# Patient Record
Sex: Male | Born: 1960
Health system: Southern US, Community
[De-identification: ages and names within clinical notes are randomized; demographics above are authoritative.]

## PROBLEM LIST (undated history)

## (undated) DIAGNOSIS — R202 Paresthesia of skin: Secondary | ICD-10-CM

## (undated) DIAGNOSIS — I10 Essential (primary) hypertension: Secondary | ICD-10-CM

## (undated) DIAGNOSIS — M109 Gout, unspecified: Secondary | ICD-10-CM

## (undated) DIAGNOSIS — M719 Bursopathy, unspecified: Secondary | ICD-10-CM

## (undated) HISTORY — DX: Essential (primary) hypertension: I10

## (undated) HISTORY — DX: Bursopathy, unspecified: M71.9

## (undated) HISTORY — DX: Gout, unspecified: M10.9

## (undated) HISTORY — DX: Paresthesia of skin: R20.2

## (undated) HISTORY — PX: TONSILLECTOMY: SUR1361

---

## 2008-02-16 ENCOUNTER — Ambulatory Visit (HOSPITAL_COMMUNITY): Admission: RE | Admit: 2008-02-16 | Discharge: 2008-02-16 | Payer: Self-pay | Admitting: Family Medicine

## 2012-01-18 ENCOUNTER — Encounter: Payer: Self-pay | Admitting: Internal Medicine

## 2012-02-21 ENCOUNTER — Encounter: Payer: Self-pay | Admitting: Internal Medicine

## 2012-02-21 ENCOUNTER — Ambulatory Visit (AMBULATORY_SURGERY_CENTER): Payer: 59 | Admitting: *Deleted

## 2012-02-21 VITALS — Ht 70.5 in | Wt 220.6 lb

## 2012-02-21 DIAGNOSIS — Z1211 Encounter for screening for malignant neoplasm of colon: Secondary | ICD-10-CM

## 2012-02-21 MED ORDER — PEG-KCL-NACL-NASULF-NA ASC-C 100 G PO SOLR: ORAL | Status: DC

## 2012-02-21 NOTE — Progress Notes (Signed)
No allergies to eggs or soy products 

## 2012-03-02 ENCOUNTER — Encounter: Payer: Self-pay | Admitting: Internal Medicine

## 2012-03-03 ENCOUNTER — Encounter: Payer: Self-pay | Admitting: Internal Medicine

## 2012-03-03 ENCOUNTER — Ambulatory Visit (AMBULATORY_SURGERY_CENTER): Payer: 59 | Admitting: Internal Medicine

## 2012-03-03 VITALS — BP 130/84 | HR 53 | Temp 96.9°F | Resp 15 | Ht 70.5 in | Wt 220.0 lb

## 2012-03-03 DIAGNOSIS — Z1211 Encounter for screening for malignant neoplasm of colon: Secondary | ICD-10-CM

## 2012-03-03 MED ORDER — SODIUM CHLORIDE 0.9 % IV SOLN: 500.0000 mL | INTRAVENOUS | Status: DC

## 2012-03-03 NOTE — Patient Instructions (Addendum)
YOU HAD AN ENDOSCOPIC PROCEDURE TODAY AT THE [facility]: Refer to the procedure report that was given to you for any specific questions about what was found during the examination.  If the procedure report does not answer your questions, please call your gastroenterologist to clarify.  If you requested that your care partner not be given the details of your procedure findings, then the procedure report has been included in a sealed envelope for you to review at your convenience later.  YOU SHOULD EXPECT: Some feelings of bloating in the abdomen. Passage of more gas than usual.  Walking can help get rid of the air that was put into your GI tract during the procedure and reduce the bloating. If you had a lower endoscopy (such as a colonoscopy or flexible sigmoidoscopy) you may notice spotting of blood in your stool or on the toilet paper. If you underwent a bowel prep for your procedure, then you may not have a normal bowel movement for a few days.  DIET: Your first meal following the procedure should be a light meal and then it is ok to progress to your normal diet.  A half-sandwich or bowl of soup is an example of a good first meal.  Heavy or fried foods are harder to digest and may make you feel nauseous or bloated.  Likewise meals heavy in dairy and vegetables can cause extra gas to form and this can also increase the bloating.  Drink plenty of fluids but you should avoid alcoholic beverages for 24 hours.  ACTIVITY: Your care partner should take you home directly after the procedure.  You should plan to take it easy, moving slowly for the rest of the day.  You can resume normal activity the day after the procedure however you should NOT DRIVE or use heavy machinery for 24 hours (because of the sedation medicines used during the test).    SYMPTOMS TO REPORT IMMEDIATELY: A gastroenterologist can be reached at any hour.  During normal business hours, 8:30 AM to 5:00 PM Monday through Friday,  call [phone].  After hours and on weekends, please call the GI answering service at [phone] who will take a message and have the physician on call contact you.   Following lower endoscopy (colonoscopy or flexible sigmoidoscopy):  Excessive amounts of blood in the stool  Significant tenderness or worsening of abdominal pains  Swelling of the abdomen that is new, acute  Fever of 100F or higher  FOLLOW UP: If any biopsies were taken you will be contacted by phone or by letter within the next 1-3 weeks.  Call your gastroenterologist if you have not heard about the biopsies in 3 weeks.  Our staff will call the home number listed on your records the next business day following your procedure to check on you and address any questions or concerns that you may have at that time regarding the information given to you following your procedure. This is a courtesy call and so if there is no answer at the home number and we have not heard from you through the emergency physician on call, we will assume that you have returned to your regular daily activities without incident.  SIGNATURES/CONFIDENTIALITY: You and/or your care partner have signed paperwork which will be entered into your electronic medical record.  These signatures attest to the fact that that the information above on your After Visit Summary has been reviewed and is understood.  Full responsibility of the confidentiality of this   discharge information lies with you and/or your care-partner.  Repeat colonoscopy in 10 years.  

## 2012-03-03 NOTE — Op Note (Signed)
[facility] [address] [place], [address]   COLONOSCOPY PROCEDURE REPORT  PATIENT: Robert Brooks, Robert Brooks  MR#: [id number] BIRTHDATE: [birth year] , 51  yrs. old GENDER: Male ENDOSCOPIST: Beverley Fiedler, MD REFERRED AO:[id number], Scott PROCEDURE DATE:  03/03/2012 PROCEDURE:   Colonoscopy, screening ASA CLASS:   Class I INDICATIONS:average risk screening and first colonoscopy. MEDICATIONS: MAC sedation, administered by CRNA and propofol (Diprivan) 400mg  IV  DESCRIPTION OF PROCEDURE:   After the risks benefits and alternatives of the procedure were thoroughly explained, informed consent was obtained.  A digital rectal exam revealed no rectal mass.   The LB CF-[id number]  endoscope was introduced through the anus and advanced to the terminal ileum which was intubated for a short distance. No adverse events experienced.   The quality of the prep was Moviprep fair  The instrument was then slowly withdrawn as the colon was fully examined.      COLON FINDINGS: The mucosa appeared normal in the terminal ileum. A normal appearing cecum, ileocecal valve, and appendiceal orifice were identified.  The ascending, hepatic flexure, transverse, splenic flexure, descending, sigmoid colon and rectum appeared unremarkable.  No polyps or cancers were seen.  Retroflexed views revealed no abnormalities. The time to cecum=3 minutes 51 seconds. Withdrawal time=11 minutes 20 seconds.  The scope was withdrawn and the procedure completed.  COMPLICATIONS: There were no complications.  ENDOSCOPIC IMPRESSION: 1.   Normal mucosa in the terminal ileum 2.   Normal colon  RECOMMENDATIONS: You should continue to follow colorectal cancer screening guidelines for "routine risk" patients with a repeat colonoscopy in 10 years. There is no need for FOBT (stool) testing for at least 5 years.   eSigned:  Beverley Fiedler, MD 03/03/2012 11:04 AM   cc: The Patient and Lilyan Punt, MD

## 2012-03-06 ENCOUNTER — Telehealth: Payer: Self-pay | Admitting: *Deleted

## 2012-03-06 NOTE — Telephone Encounter (Signed)
  Follow up Call-  Call back number 03/03/2012  Post procedure Call Back phone  # [phone]  Permission to leave phone message Yes     Patient questions:  Do you have a fever, pain , or abdominal swelling? no Pain Score  0 *  Have you tolerated food without any problems? yes  Have you been able to return to your normal activities? yes  Do you have any questions about your discharge instructions: Diet   no Medications  no Follow up visit  no  Do you have questions or concerns about your Care? no  Actions: * If pain score is 4 or above: No action needed, pain <4.

## 2012-09-19 ENCOUNTER — Ambulatory Visit (INDEPENDENT_AMBULATORY_CARE_PROVIDER_SITE_OTHER): Payer: BC Managed Care – PPO | Admitting: Family Medicine

## 2012-09-19 ENCOUNTER — Encounter: Payer: Self-pay | Admitting: Family Medicine

## 2012-09-19 VITALS — BP 150/100 | HR 80 | Wt 216.0 lb

## 2012-09-19 DIAGNOSIS — R0600 Dyspnea, unspecified: Secondary | ICD-10-CM

## 2012-09-19 DIAGNOSIS — R030 Elevated blood-pressure reading, without diagnosis of hypertension: Secondary | ICD-10-CM

## 2012-09-19 DIAGNOSIS — R0989 Other specified symptoms and signs involving the circulatory and respiratory systems: Secondary | ICD-10-CM

## 2012-09-19 DIAGNOSIS — R9431 Abnormal electrocardiogram [ECG] [EKG]: Secondary | ICD-10-CM | POA: Insufficient documentation

## 2012-09-19 NOTE — Patient Instructions (Signed)
DASH Diet  The DASH diet stands for "Dietary Approaches to Stop Hypertension." It is a healthy eating plan that has been shown to reduce high blood pressure (hypertension) in as little as 14 days, while also possibly providing other significant health benefits. These other health benefits include reducing the risk of breast cancer after menopause and reducing the risk of type 2 diabetes, heart disease, colon cancer, and stroke. Health benefits also include weight loss and slowing kidney failure in patients with chronic kidney disease.   DIET GUIDELINES  · Limit salt (sodium). Your diet should contain less than 1500 mg of sodium daily.  · Limit refined or processed carbohydrates. Your diet should include mostly whole grains. Desserts and added sugars should be used sparingly.  · Include small amounts of heart-healthy fats. These types of fats include nuts, oils, and tub margarine. Limit saturated and trans fats. These fats have been shown to be harmful in the body.  CHOOSING FOODS   The following food groups are based on a 2000 calorie diet. See your Registered Dietitian for individual calorie needs.  Grains and Grain Products (6 to 8 servings daily)  · Eat More Often: Whole-wheat bread, brown rice, whole-grain or wheat pasta, quinoa, popcorn without added fat or salt (air popped).  · Eat Less Often: White bread, white pasta, white rice, cornbread.  Vegetables (4 to 5 servings daily)  · Eat More Often: Fresh, frozen, and canned vegetables. Vegetables may be raw, steamed, roasted, or grilled with a minimal amount of fat.  · Eat Less Often/Avoid: Creamed or fried vegetables. Vegetables in a cheese sauce.  Fruit (4 to 5 servings daily)  · Eat More Often: All fresh, canned (in natural juice), or frozen fruits. Dried fruits without added sugar. One hundred percent fruit juice (½ cup [237 mL] daily).  · Eat Less Often: Dried fruits with added sugar. Canned fruit in light or heavy syrup.  Lean Meats, Fish, and Poultry (2  servings or less daily. One serving is 3 to 4 oz [85-114 g]).  · Eat More Often: Ninety percent or leaner ground beef, tenderloin, sirloin. Round cuts of beef, chicken breast, turkey breast. All fish. Grill, bake, or broil your meat. Nothing should be fried.  · Eat Less Often/Avoid: Fatty cuts of meat, turkey, or chicken leg, thigh, or wing. Fried cuts of meat or fish.  Dairy (2 to 3 servings)  · Eat More Often: Low-fat or fat-free milk, low-fat plain or light yogurt, reduced-fat or part-skim cheese.  · Eat Less Often/Avoid: Milk (whole, 2%). Whole milk yogurt. Full-fat cheeses.  Nuts, Seeds, and Legumes (4 to 5 servings per week)  · Eat More Often: All without added salt.  · Eat Less Often/Avoid: Salted nuts and seeds, canned beans with added salt.  Fats and Sweets (limited)  · Eat More Often: Vegetable oils, tub margarines without trans fats, sugar-free gelatin. Mayonnaise and salad dressings.  · Eat Less Often/Avoid: Coconut oils, palm oils, butter, stick margarine, cream, half and half, cookies, candy, pie.  FOR MORE INFORMATION  The Dash Diet Eating Plan: www.dashdiet.org  Document Released: 02/18/2011 Document Revised: 05/24/2011 Document Reviewed: 02/18/2011  ExitCare® Patient Information ©2014 [employer].

## 2012-09-19 NOTE — Progress Notes (Signed)
  Subjective:    Patient ID: Robert Brooks, male    DOB: January 09, 1961, 52 y.o.   MRN: [id number]  HPI Here to get referral to cardiology. 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 this patient recently had evaluation for his workplace was evaluated and was told he had abnormal EKG comes here for evaluation he denies chest tightness pressure pain shortness of breath. He cannot run because of his legs give him significant pain with a lot of running especially his shins. He denies any injury. Denies any history of heart disease hypertension. Does not smoke no family history of premature heart disease. His workplace were is requiring him to get checked out before they will allow him to be hired.    Review of Systems negative as per above no PND no orthopnea no swelling in the legs no fevers or cough no chest tightness pressure pain or shortness of breath     Objective:   Physical Exam  Blood pressure is mildly elevated on recheck it was 148/96 he states in the past has been normal and has been normal here at the office several times it is possible he is stressed out because of the recent problems with his EKG  Lungs are clear no crackles heart is regular no murmurs pulses are normal extremities no edema abdomen is soft EKG does have some T-wave inversions on the lateral leads.      Assessment & Plan:  #1 elevated blood pressure-he is encouraged to do walking for exercise watch his diet try to reduce his weight a few pounds. In addition to this to do the DASH diet. It is possible he may have to be on medication in the future #2 abnormal EKG-even though I think his risk of heart disease is low in his recent cholesterol numbers look fairly good I would highly recommend cardiology evaluation along with a stress test and possibly a echo to help delineate what is going on. This will also help him with his future employment.

## 2012-09-20 NOTE — Addendum Note (Signed)
Addended by: Lilyan Punt A on: 09/20/2012 05:30 PM   Modules accepted: Orders

## 2012-09-22 ENCOUNTER — Encounter: Payer: Self-pay | Admitting: *Deleted

## 2012-09-22 ENCOUNTER — Encounter: Payer: Self-pay | Admitting: Cardiovascular Disease

## 2012-09-22 ENCOUNTER — Ambulatory Visit (INDEPENDENT_AMBULATORY_CARE_PROVIDER_SITE_OTHER): Payer: BC Managed Care – PPO | Admitting: Cardiovascular Disease

## 2012-09-22 ENCOUNTER — Ambulatory Visit: Payer: 59 | Admitting: Cardiovascular Disease

## 2012-09-22 VITALS — BP 124/80 | HR 80 | Ht 70.5 in | Wt 211.0 lb

## 2012-09-22 DIAGNOSIS — R9431 Abnormal electrocardiogram [ECG] [EKG]: Secondary | ICD-10-CM

## 2012-09-22 NOTE — Patient Instructions (Addendum)
Your physician recommends that you schedule a follow-up appointment in: TO BE DETERMINED  Your physician has requested that you have an echocardiogram. Echocardiography is a painless test that uses sound waves to create images of your heart. It provides your doctor with information about the size and shape of your heart and how well your heart's chambers and valves are working. This procedure takes approximately one hour. There are no restrictions for this procedure.  Your physician has requested that you have en exercise stress myoview. For further information please visit [email or web address] Please follow instruction sheet, as given.

## 2012-09-22 NOTE — Progress Notes (Signed)
Patient ID: Robert Brooks, male   DOB: 14-Jan-1961, 52 y.o.   MRN: [id number]    CARDIOLOGY CONSULT NOTE  Patient ID: Robert Brooks MRN: [id number] DOB/AGE: January 09, 1961 52 y.o.  Admit date: (Not on file) Primary Physician: Robert Punt, MD Reason for Consultation: abnormal ECG  HPI:  Robert Brooks is a 52 y.o. Male who presents for evaluation for an abnormal ECG.  A lipid panel on 07/23/2012 showed an LDL of 133, HDL of 50, TG 99, with a total cholesterol of 203 mg/dL.  He recently retired from the [employer] and is planning on working for the [employer].   He had a screening physical and ECG, and due to an abnormal ECG, he was referred today.  He walks 4 miles daily 5 days a week. He feels well and is without chest pain or shortness of breath.  His father died at 45 of CHF, but didn't exercise and also smoked.  He's put on 6 lbs since retirement.  Review of systems complete and found to be negative unless listed above in HPI  Past Medical History: As per HPI, none to speak of.  Soc Hx: retired June 13, 2012 from the [employer] He's worked for the [employer] for the past 7 years as well.  Family History  Problem Relation Age of Onset  . Colon cancer Neg Hx   . Esophageal cancer Neg Hx   . Rectal cancer Neg Hx   . Stomach cancer Neg Hx     History   Social History  . Marital Status: Married    Spouse Name: N/A    Number of Children: N/A  . Years of Education: N/A   Occupational History  . Not on file.   Social History Main Topics  . Smoking status: Never Smoker   . Smokeless tobacco: Never Used  . Alcohol Use: Yes     Comment: socially  . Drug Use: No  . Sexually Active:    Other Topics Concern  . Not on file   Social History Narrative  . No narrative on file      (Not in a hospital admission)  Physical exam BP: 124/80 mmHg HR: 80 bpm  General: NAD Neck: No JVD, no thyromegaly or thyroid nodule.  Lungs: Clear to  auscultation bilaterally with normal respiratory effort. CV: Nondisplaced PMI.  Heart regular S1/S2, no S3/S4, no murmur.  No peripheral edema.  No carotid bruit.  Normal pedal pulses.  Abdomen: Soft, nontender, no hepatosplenomegaly, no distention.  Skin: Intact without lesions or rashes.  Neurologic: Alert and oriented x 3.  Psych: Normal affect. Extremities: No clubbing or cyanosis.  HEENT: Normal.   Labs:   No results found for this basename: WBC, HGB, HCT, MCV, PLT   No results found for this basename: NA, K, CL, CO2, BUN, CREATININE, CALCIUM, LABALBU, PROT, BILITOT, ALKPHOS, ALT, AST, GLUCOSE,  in the last 168 hours No results found for this basename: CKTOTAL, CKMB, CKMBINDEX, TROPONINI    No results found for this basename: CHOL   No results found for this basename: HDL   No results found for this basename: LDLCALC   No results found for this basename: TRIG   No results found for this basename: CHOLHDL   No results found for this basename: LDLDIRECT       EKG: Sinus rhythm, rate 63 bpm, axis within normal limits, intervals within normal limits, ST-T abnormality in lateral leads, consider ischemia. T wave flattening in  inferior leads.    ASSESSMENT AND PLAN:  1. Abnormal ECG: he is asymptomatic and without a PMH to speak of. Given the [employer] requirements, I will proceed with an echocardiogram and treadmill Myoview stress test.  Signed: Prentice Brooks, M.D., F.A.C.C. 09/22/2012, 1:00 PM

## 2012-09-27 ENCOUNTER — Ambulatory Visit (HOSPITAL_COMMUNITY)
Admission: RE | Admit: 2012-09-27 | Discharge: 2012-09-27 | Disposition: A | Discharge status: Home / Self Care | Payer: BC Managed Care – PPO | Source: Ambulatory Visit | Attending: Cardiovascular Disease | Admitting: Cardiovascular Disease

## 2012-09-27 ENCOUNTER — Encounter (HOSPITAL_COMMUNITY)
Admission: RE | Admit: 2012-09-27 | Discharge: 2012-09-27 | Disposition: A | Discharge status: Home / Self Care | Payer: BC Managed Care – PPO | Source: Ambulatory Visit | Attending: Cardiovascular Disease | Admitting: Cardiovascular Disease

## 2012-09-27 ENCOUNTER — Encounter (HOSPITAL_COMMUNITY): Payer: Self-pay

## 2012-09-27 DIAGNOSIS — R9431 Abnormal electrocardiogram [ECG] [EKG]: Secondary | ICD-10-CM

## 2012-09-27 DIAGNOSIS — I517 Cardiomegaly: Secondary | ICD-10-CM

## 2012-09-27 MED ORDER — TECHNETIUM TC 99M SESTAMIBI - CARDIOLITE
30.0000 | Freq: Once | INTRAVENOUS | Status: AC | PRN
  Administered 2012-09-27: 12:00:00 31 via INTRAVENOUS

## 2012-09-27 MED ORDER — SODIUM CHLORIDE 0.9 % IJ SOLN
INTRAMUSCULAR | Status: AC
  Administered 2012-09-27: 10 mL via INTRAVENOUS
  Filled 2012-09-27: qty 10

## 2012-09-27 MED ORDER — TECHNETIUM TC 99M SESTAMIBI - CARDIOLITE
10.0000 | Freq: Once | INTRAVENOUS | Status: AC | PRN
  Administered 2012-09-27: 10:00:00 10 via INTRAVENOUS

## 2012-09-27 NOTE — Progress Notes (Signed)
Stress Lab Nurses Notes - [facility]  CORNELIOUS BARTOLUCCI 09/27/2012 Reason for doing test: Abnormal ECG Type of test: Stress Cardiolite Nurse performing test: Parke Poisson, RN Nuclear Medicine Tech: Lou Cal Echo Tech: Not Applicable MD performing test: R. Rothbart & Jacolyn Reedy PA Family MD: Lilyan Punt Test explained and consent signed: yes IV started: 22g jelco, Saline lock flushed, No redness or edema and Saline lock started in radiology Symptoms: Fatigue in legs Treatment/Intervention: None Reason test stopped: fatigue After recovery IV was: Discontinued via X-ray tech and No redness or edema Patient to return to Nuc. Med at : 12:45 Patient discharged: Home Patient's Condition upon discharge was: stable Comments: During test peak BP 138/82 & HR 166.  Recovery BP 113/67 & HR 96.  Symptoms resolved in recovery. Erskine Speed T

## 2012-09-27 NOTE — Progress Notes (Signed)
*  PRELIMINARY RESULTS* Echocardiogram 2D Echocardiogram has been performed.  Conrad Wagener 09/27/2012, 8:49 AM

## 2012-09-28 ENCOUNTER — Telehealth: Payer: Self-pay | Admitting: Cardiology

## 2012-09-28 NOTE — Telephone Encounter (Signed)
Results of tests that were done yesterday (09/27/12)/tgs

## 2012-09-29 NOTE — Telephone Encounter (Signed)
Spoke to pt concerning test results and forms for his employment, Dr. Purvis Sheffield completed forms,printed pt office notes/tests/EKGs Pt came to office and picked up all paperwork, will contact office with any further concerns if needed

## 2013-03-13 ENCOUNTER — Telehealth: Payer: Self-pay | Admitting: Family Medicine

## 2013-03-13 NOTE — Telephone Encounter (Signed)
No. Can discuss further with dr Lorin Picket at [facility] for yearly p e in several months

## 2013-03-13 NOTE — Telephone Encounter (Signed)
Patient had his physical and colonoscopy last year. He did not have any family history of having any colon cancer until about 9 months ago. He wants to know if since he now has family history of this should he start being checked more often than 10 years?

## 2013-03-13 NOTE — Telephone Encounter (Signed)
Notified patient no. Can discuss further with dr scott at [facility] for yearly p e in several months. Patient verbalized understanding.

## 2013-12-18 IMAGING — NM NM MYOCAR SINGLE W/SPECT W/WALL MOTION & EF
1 series · 6 of 6 positions shown · non-contrast
Comparison: none

Ordering Physician: LUSINE JIM

[place]olan Physician: [REDACTED]al Data: 51-year-old law enforcement officer with EKG
abnormalities.
NUCLEAR MEDICINE STRESS MYOVIEW STUDY WITH SPECT AND LEFT
VENTRICULAR EJECTION FRACTION
Radionuclide Data: One-day rest/stress protocol performed with
[DATE] mCi of 7c-EEm Myoview.
Stress Data: Treadmill exercise performed to a workload of
mets and a heart rate of 169, 99% of age predicted maximum.
Exercise discontinued due to mild leg discomfort; no chest
discomfort reported.  No arrhythmias noted.  Blood pressure
increased from a resting value of 120/80 to 140/80 at peak
exercise, a fairly flat response.
EKG: Sinus bradycardia at a rate of 51 bpm; inferolateral/apical
distribution of T-wave inversion.
Stress EKG:  T-wave abnormalities normalized; no significant ST-
segment changes.  No arrhythmias.
Scintigraphic Data: Acquisition notable for mild diaphragmatic
attenuation.  Left ventricular size was normal.  On tomographic
images reconstructed in standard planes, there was uniform and
normal uptake of tracer in all myocardial segments.  The rest
images were unchanged.  The gated reconstruction demonstrated
normal regional and global LV systolic function as well as normal
systolic accentuation of activity throughout.  Estimated ejection
fraction was 54%.

[cr cardiac tc low dose · 6.41mm/px · 6 of 64 frames shown]
[frame 6/64]
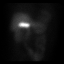
[frame 16/64]
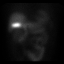
[frame 27/64]
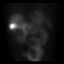
[frame 38/64]
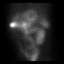
[frame 48/64]
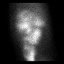
[frame 59/64]
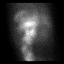

[6 of 6 positions shown; findings below may reference images not displayed]

IMPRESSION: Negative stress nuclear myocardial study with excellent exercise
tolerance.

## 2015-05-01 ENCOUNTER — Telehealth: Payer: Self-pay | Admitting: Family Medicine

## 2015-05-01 MED ORDER — OSELTAMIVIR PHOSPHATE 75 MG PO CAPS: 75.0000 mg | ORAL_CAPSULE | Freq: Two times a day (BID) | ORAL | Status: DC

## 2015-05-01 NOTE — Telephone Encounter (Signed)
Patient reports fever, cough, headache and body aches-started last night

## 2015-05-01 NOTE — Telephone Encounter (Signed)
Rx sent electronically to pharmacy. Patient notified and advised to follow up in office if ongoing problems.

## 2015-05-01 NOTE — Telephone Encounter (Signed)
Typically for this what is recommended is Tamiflu 75 mg capsule one twice a day for 5 days. May send in prescription. If ongoing troubles complications or problems I highly recommend being checked

## 2015-05-01 NOTE — Telephone Encounter (Signed)
Wife was seen on Monday  With the flu and now husband has the flu and wanting something called in [place]. [phone]

## 2015-05-01 NOTE — Addendum Note (Signed)
Addended by: Dairl Ponder on: 05/01/2015 10:33 AM   Modules accepted: Orders

## 2015-07-10 ENCOUNTER — Telehealth: Payer: Self-pay | Admitting: Family Medicine

## 2015-07-10 DIAGNOSIS — Z139 Encounter for screening, unspecified: Secondary | ICD-10-CM

## 2015-07-10 DIAGNOSIS — I10 Essential (primary) hypertension: Secondary | ICD-10-CM

## 2015-07-10 DIAGNOSIS — Z125 Encounter for screening for malignant neoplasm of prostate: Secondary | ICD-10-CM

## 2015-07-10 NOTE — Telephone Encounter (Signed)
CBC, liver, met 7, lipid, PSA

## 2015-07-10 NOTE — Telephone Encounter (Signed)
bw orders for a PE on 5/22  Last labs scanned in from his job 2014

## 2015-07-10 NOTE — Telephone Encounter (Signed)
Orders for bloodwork put in. Pt notified.  

## 2015-07-29 LAB — HEPATIC FUNCTION PANEL
ALT: 23 IU/L (ref 0–44)
AST: 22 IU/L (ref 0–40)
Albumin: 4.5 g/dL (ref 3.5–5.5)
Alkaline Phosphatase: 51 IU/L (ref 39–117)
Bilirubin Total: 0.8 mg/dL (ref 0.0–1.2)
Bilirubin, Direct: 0.21 mg/dL (ref 0.00–0.40)
Total Protein: 6.5 g/dL (ref 6.0–8.5)

## 2015-07-29 LAB — BASIC METABOLIC PANEL
BUN / CREAT RATIO: 20 (ref 9–20)
BUN: 21 mg/dL (ref 6–24)
CALCIUM: 9.4 mg/dL (ref 8.7–10.2)
CHLORIDE: 105 mmol/L (ref 96–106)
CO2: 22 mmol/L (ref 18–29)
Creatinine, Ser: 1.06 mg/dL (ref 0.76–1.27)
GFR calc Af Amer: 92 mL/min/{1.73_m2} (ref >59)
GFR calc non Af Amer: 80 mL/min/{1.73_m2} (ref >59)
GLUCOSE: 85 mg/dL (ref 65–99)
POTASSIUM: 5 mmol/L (ref 3.5–5.2)
Sodium: 143 mmol/L (ref 134–144)

## 2015-07-29 LAB — CBC WITH DIFFERENTIAL/PLATELET
BASOS ABS: 0 10*3/uL (ref 0.0–0.2)
Basos: 1 %
EOS (ABSOLUTE): 0.1 10*3/uL (ref 0.0–0.4)
Eos: 2 %
HEMOGLOBIN: 16.2 g/dL (ref 12.6–17.7)
Hematocrit: 46.5 % (ref 37.5–51.0)
Immature Grans (Abs): 0 10*3/uL (ref 0.0–0.1)
Immature Granulocytes: 0 %
LYMPHS ABS: 2 10*3/uL (ref 0.7–3.1)
LYMPHS: 37 %
MCH: 30.8 pg (ref 26.6–33.0)
MCHC: 34.8 g/dL (ref 31.5–35.7)
MCV: 88 fL (ref 79–97)
MONOCYTES: 10 %
Monocytes Absolute: 0.5 10*3/uL (ref 0.1–0.9)
NEUTROS ABS: 2.6 10*3/uL (ref 1.4–7.0)
Neutrophils: 50 %
PLATELETS: 231 10*3/uL (ref 150–379)
RBC: 5.26 x10E6/uL (ref 4.14–5.80)
RDW: 13.3 % (ref 12.3–15.4)
WBC: 5.3 10*3/uL (ref 3.4–10.8)

## 2015-07-29 LAB — LIPID PANEL
CHOL/HDL RATIO: 2.9 ratio (ref 0.0–5.0)
CHOLESTEROL TOTAL: 168 mg/dL (ref 100–199)
HDL: 58 mg/dL (ref >39)
LDL CALC: 96 mg/dL (ref 0–99)
TRIGLYCERIDES: 72 mg/dL (ref 0–149)
VLDL Cholesterol Cal: 14 mg/dL (ref 5–40)

## 2015-07-29 LAB — PSA: PROSTATE SPECIFIC AG, SERUM: 0.5 ng/mL (ref 0.0–4.0)

## 2015-08-04 ENCOUNTER — Encounter: Payer: Self-pay | Admitting: Family Medicine

## 2015-08-04 ENCOUNTER — Ambulatory Visit (INDEPENDENT_AMBULATORY_CARE_PROVIDER_SITE_OTHER): Payer: No Typology Code available for payment source | Admitting: Family Medicine

## 2015-08-04 VITALS — BP 118/80 | Ht 69.25 in | Wt 201.0 lb

## 2015-08-04 DIAGNOSIS — H9319 Tinnitus, unspecified ear: Secondary | ICD-10-CM | POA: Insufficient documentation

## 2015-08-04 DIAGNOSIS — H9313 Tinnitus, bilateral: Secondary | ICD-10-CM | POA: Diagnosis not present

## 2015-08-04 DIAGNOSIS — Z800 Family history of malignant neoplasm of digestive organs: Secondary | ICD-10-CM | POA: Diagnosis not present

## 2015-08-04 DIAGNOSIS — Z0000 Encounter for general adult medical examination without abnormal findings: Secondary | ICD-10-CM

## 2015-08-04 DIAGNOSIS — M1000 Idiopathic gout, unspecified site: Secondary | ICD-10-CM | POA: Diagnosis not present

## 2015-08-04 NOTE — Progress Notes (Addendum)
   Subjective:    Patient ID: Robert Brooks, male    DOB: 06/07/1960, 55 y.o.   MRN: [id number]  HPI The patient comes in today for a wellness visit.  Last colonoscopy 03/03/2012.   A review of their health history was completed.  A review of medications was also completed.  Any needed refills; not taking any prescriptions   Eating habits: health conscious  Falls/  MVA accidents in past few months: none  Regular exercise: walks every day 2 -3 miles.   Specialist pt sees on regular basis: none  Preventative health issues were discussed.   Additional concerns: ears are ringing. Started 7 months. Pt states he will see ENT for this issue.   Gout in both feet. Patient states he did have problems with this in the past but is gone away since she's been following a better diet he is not interested in doing anything about currently    Review of Systems  Constitutional: Negative for fever, activity change and appetite change.  HENT: Negative for congestion and rhinorrhea.   Eyes: Negative for discharge.  Respiratory: Negative for cough and wheezing.   Cardiovascular: Negative for chest pain.  Gastrointestinal: Negative for vomiting, abdominal pain and blood in stool.  Genitourinary: Negative for frequency and difficulty urinating.  Musculoskeletal: Negative for neck pain.  Skin: Negative for rash.  Allergic/Immunologic: Negative for environmental allergies and food allergies.  Neurological: Negative for weakness and headaches.  Psychiatric/Behavioral: Negative for agitation.       Objective:   Physical Exam  Constitutional: He appears well-developed and well-nourished.  HENT:  Head: Normocephalic and atraumatic.  Right Ear: External ear normal.  Left Ear: External ear normal.  Nose: Nose normal.  Mouth/Throat: Oropharynx is clear and moist.  Eyes: EOM are normal. Pupils are equal, round, and reactive to light.  Neck: Normal range of motion. Neck supple. No thyromegaly  present.  Cardiovascular: Normal rate, regular rhythm and normal heart sounds.   No murmur heard. Pulmonary/Chest: Effort normal and breath sounds normal. No respiratory distress. He has no wheezes.  Abdominal: Soft. Bowel sounds are normal. He exhibits no distension and no mass. There is no tenderness.  Genitourinary: Penis normal.  Musculoskeletal: Normal range of motion. He exhibits no edema.  Lymphadenopathy:    He has no cervical adenopathy.  Neurological: He is alert. He exhibits normal muscle tone.  Skin: Skin is warm and dry. No erythema.  Psychiatric: He has a normal mood and affect. His behavior is normal. Judgment normal.    tinnitis      Assessment & Plan:  1. Routine general medical examination at a health care facility Overall patient doing well safety dietary discussed. Recent lab work looks great. Up-to-date on colonoscopy will need one next year because of family history mom was diagnosed a few years ago  2. Idiopathic gout, unspecified chronicity, unspecified site Patient has occasional gout will follow-up low purine diet no lab work indicated if he has more trouble he will let us know  3. 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

## 2015-09-10 ENCOUNTER — Encounter: Payer: Self-pay | Admitting: Family Medicine

## 2015-09-10 ENCOUNTER — Ambulatory Visit (INDEPENDENT_AMBULATORY_CARE_PROVIDER_SITE_OTHER): Payer: No Typology Code available for payment source | Admitting: Family Medicine

## 2015-09-10 VITALS — BP 102/70 | Temp 98.1°F | Ht 69.25 in | Wt 202.4 lb

## 2015-09-10 DIAGNOSIS — M10072 Idiopathic gout, left ankle and foot: Secondary | ICD-10-CM | POA: Diagnosis not present

## 2015-09-10 MED ORDER — COLCHICINE 0.6 MG PO TABS: ORAL_TABLET | ORAL | Status: DC

## 2015-09-10 MED ORDER — NAPROXEN 500 MG PO TABS: 500.0000 mg | ORAL_TABLET | Freq: Two times a day (BID) | ORAL | Status: DC

## 2015-09-10 NOTE — Progress Notes (Signed)
   Subjective:    Patient ID: Robert Brooks, male    DOB: [birth year], 55 y.o.   MRN: [id number]  Foot Pain This is a new problem. The current episode started in the past 7 days. The problem occurs constantly. The problem has been unchanged. Nothing aggravates the symptoms. He has tried NSAIDs for the symptoms. The treatment provided mild relief.   Patient has no other concerns at this time.   Patient states he's been eating a lot more protein had a couple beers related pain and discomfort been progressive over the course of next several days Review of Systems Patient relates pain discomfort in the joint. Some redness and swelling    Objective:   Physical Exam On physical exam there is apparent gout on the left foot. At the MTP joint. Ankle calf is normal       Assessment & Plan:  Gout-Importance of low purine diet in addition to this recommend Naprosyn twice a day until relief plus also consider Colcrys Patient was also told the importance of checking blood level in May need to recheck it again in order to make sure that uric acid level is staying low preferably below 6

## 2015-09-10 NOTE — Patient Instructions (Signed)

## 2015-09-11 ENCOUNTER — Other Ambulatory Visit: Payer: Self-pay

## 2015-09-11 DIAGNOSIS — M109 Gout, unspecified: Secondary | ICD-10-CM

## 2015-09-11 LAB — BASIC METABOLIC PANEL
BUN/Creatinine Ratio: 19 (ref 9–20)
BUN: 19 mg/dL (ref 6–24)
CHLORIDE: 105 mmol/L (ref 96–106)
CO2: 26 mmol/L (ref 18–29)
CREATININE: 1 mg/dL (ref 0.76–1.27)
Calcium: 9.4 mg/dL (ref 8.7–10.2)
GFR calc Af Amer: 99 mL/min/{1.73_m2} (ref >59)
GFR calc non Af Amer: 86 mL/min/{1.73_m2} (ref >59)
GLUCOSE: 75 mg/dL (ref 65–99)
POTASSIUM: 4.8 mmol/L (ref 3.5–5.2)
SODIUM: 148 mmol/L — AB (ref 134–144)

## 2015-09-11 LAB — URIC ACID: URIC ACID: 7.7 mg/dL (ref 3.7–8.6)

## 2015-11-22 ENCOUNTER — Other Ambulatory Visit: Payer: Self-pay | Admitting: Family Medicine

## 2015-11-22 DIAGNOSIS — M10072 Idiopathic gout, left ankle and foot: Secondary | ICD-10-CM

## 2017-03-15 HISTORY — PX: COLONOSCOPY: SHX174

## 2017-03-22 ENCOUNTER — Telehealth: Payer: Self-pay | Admitting: Family Medicine

## 2017-03-22 DIAGNOSIS — Z1211 Encounter for screening for malignant neoplasm of colon: Secondary | ICD-10-CM

## 2017-03-22 NOTE — Telephone Encounter (Signed)
He may have referral, patient also needs to set himself up for wellness exam this spring

## 2017-03-22 NOTE — Telephone Encounter (Addendum)
Referral ordered in Epic. Patient notified and stated he will schedule a physical in the spring

## 2017-03-22 NOTE — Telephone Encounter (Signed)
Pt LMOVM  States needs referral for screening colonoscopy Had one 5 years ago with [facility], would like to go there again  Please initiate referral in system so that I may process

## 2017-03-28 ENCOUNTER — Encounter: Payer: Self-pay | Admitting: Family Medicine

## 2017-03-30 ENCOUNTER — Encounter: Payer: Self-pay | Admitting: Internal Medicine

## 2017-05-19 ENCOUNTER — Encounter: Payer: Self-pay | Admitting: *Deleted

## 2017-06-03 ENCOUNTER — Ambulatory Visit (INDEPENDENT_AMBULATORY_CARE_PROVIDER_SITE_OTHER): Payer: BLUE CROSS/BLUE SHIELD | Admitting: Internal Medicine

## 2017-06-03 ENCOUNTER — Encounter: Payer: Self-pay | Admitting: Internal Medicine

## 2017-06-03 VITALS — BP 122/88 | HR 72 | Ht 70.0 in | Wt 207.0 lb

## 2017-06-03 DIAGNOSIS — K648 Other hemorrhoids: Secondary | ICD-10-CM

## 2017-06-03 DIAGNOSIS — Z800 Family history of malignant neoplasm of digestive organs: Secondary | ICD-10-CM

## 2017-06-03 MED ORDER — NA SULFATE-K SULFATE-MG SULF 17.5-3.13-1.6 GM/177ML PO SOLN: 1.0000 | ORAL | 0 refills | Status: DC

## 2017-06-03 NOTE — Patient Instructions (Addendum)
You have been scheduled for a colonoscopy. Please follow written instructions given to you at your visit today.  Please pick up your prep supplies at the pharmacy within the next 1-3 days. If you use inhalers (even only as needed), please bring them with you on the day of your procedure. Your physician has requested that you go to www.startemmi.com and enter the access code given to you at your visit today. This web site gives a general overview about your procedure. However, you should still follow specific instructions given to you by our office regarding your preparation for the procedure.  Normal BMI (Body Mass Index- based on height and weight) is between 19 and 25. Your BMI today is Body mass index is 29.7 kg/m. Marland Kitchen Please consider follow up  regarding your BMI with your Primary Care Provider.

## 2017-06-03 NOTE — Progress Notes (Signed)
Patient ID: Robert Brooks, male   DOB: November 14, 1960, 57 y.o.   MRN: [id number] HPI: Robert Brooks is a 57 year old male with a past medical history of gout and family history of colon cancer in his mother when she was age 72 years old who is seen in consultation at the request of Dr. Wolfgang Phoenix to discuss sooner screening colonoscopy based on family history.  He is here alone today.  He had a screening colonoscopy performed on 03/03/2012 which was normal.  After this his mother was diagnosed with colon cancer which was an early stage and she had resection and is doing well.  He denies specific GI complaint though occasionally has hemorrhoidal symptoms which he attributes to heavy lifting or straining.  He will occasionally have small-volume bright red blood per rectum which is painless in nature.  It can be slightly irritated in the perianal skin with wiping on occasion.  He does use Metamucil each evening and this keeps his bowel habits regular.  No change in bowel habits.  No abdominal pain.  No upper GI or hepatobiliary complaint.  Past Medical History:  Diagnosis Date  . Bursitis    left shoulder  . Gout     Past Surgical History:  Procedure Laterality Date  . TONSILLECTOMY      Outpatient Medications Prior to Visit  Medication Sig Dispense Refill  . acetaminophen (TYLENOL) 325 MG tablet Take 650 mg by mouth every 6 (six) hours as needed.    . Omega-3 Fatty Acids (FISH OIL) 1000 MG CAPS Take by mouth daily.    Marland Kitchen MITIGARE 0.6 MG CAPS TAKE 2 CAPSULES BY MOUTH AT THE FIRST SIGN, THEN 1 CAPSULE BY MOUTH EVERY 12 HOURS UNTIL RELIEF 20 capsule 0  . Multiple Vitamin (MULTIVITAMIN) tablet Take 1 tablet by mouth daily.    . naproxen (NAPROSYN) 500 MG tablet Take 1 tablet (500 mg total) by mouth 2 (two) times daily with a meal. 60 tablet 2  . Omega-3 Fatty Acids (FISH OIL PO) Take by mouth.     No facility-administered medications prior to visit.     No Known Allergies  Family History  Problem  Relation Age of Onset  . Colon cancer Mother 57  . Diabetes Mother   . Heart disease Father   . Diabetes Father   . Esophageal cancer Neg Hx   . Rectal cancer Neg Hx   . Stomach cancer Neg Hx     Social History   Tobacco Use  . Smoking status: Never Smoker  . Smokeless tobacco: Never Used  Substance Use Topics  . Alcohol use: Yes    Comment: socially  . Drug use: No    ROS: As per history of present illness, otherwise negative  BP 122/88   Pulse 72   Ht 5\' 10"  (1.778 m)   Wt 207 lb (93.9 kg)   BMI 29.70 kg/m  Constitutional: Well-developed and well-nourished. No distress. HEENT: Normocephalic and atraumatic.  Conjunctivae are normal.  No scleral icterus. Neck: Neck supple. Trachea midline. Cardiovascular: Normal rate, regular rhythm and intact distal pulses. No M/R/G Pulmonary/chest: Effort normal and breath sounds normal. No wheezing, rales or rhonchi. Abdominal: Soft, nontender, nondistended. Bowel sounds active throughout. There are no masses palpable. No hepatosplenomegaly. Extremities: no clubbing, cyanosis, or edema Neurological: Alert and oriented to person place and time. Skin: Skin is warm and dry.  Psychiatric: Normal mood and affect. Behavior is normal.  RELEVANT LABS AND IMAGING: CBC    Component Value Date/Time  WBC 5.3 07/28/2015 0835   RBC 5.26 07/28/2015 0835   HGB 16.2 07/28/2015 0835   HCT 46.5 07/28/2015 0835   PLT 231 07/28/2015 0835   MCV 88 07/28/2015 0835   MCH 30.8 07/28/2015 0835   MCHC 34.8 07/28/2015 0835   RDW 13.3 07/28/2015 0835   LYMPHSABS 2.0 07/28/2015 0835   EOSABS 0.1 07/28/2015 0835   BASOSABS 0.0 07/28/2015 0835    CMP     Component Value Date/Time   NA 148 (H) 09/10/2015 1612   K 4.8 09/10/2015 1612   CL 105 09/10/2015 1612   CO2 26 09/10/2015 1612   GLUCOSE 75 09/10/2015 1612   BUN 19 09/10/2015 1612   CREATININE 1.00 09/10/2015 1612   CALCIUM 9.4 09/10/2015 1612   PROT 6.5 07/28/2015 0835   ALBUMIN 4.5  07/28/2015 0835   AST 22 07/28/2015 0835   ALT 23 07/28/2015 0835   ALKPHOS 51 07/28/2015 0835   BILITOT 0.8 07/28/2015 0835   GFRNONAA 86 09/10/2015 1612   GFRAA 99 09/10/2015 1612    ASSESSMENT/PLAN: 57 year old male with a past medical history of gout and family history of colon cancer in his mother when she was age 62 years old who is here to discuss sooner screening colonoscopy based on family history.  1.  Family history of colon cancer in mother --repeat screening colonoscopy given family history is appropriate at this time, his last exam was just over 5 years ago.  I have recommended colonoscopy and after discussion of the risks, benefits and alternatives he is agreeable and wishes to proceed.  Screening interval will be every 5 years but also based on findings at next colonoscopy  2.  Hemorrhoids --symptoms sound most consistent with internal hemorrhoids.  We can evaluate this at upcoming colonoscopy.  We discussed banding should symptoms become more frequent.   VX:[id number], Elayne Snare, [place], [place] [id number]

## 2017-06-14 ENCOUNTER — Ambulatory Visit (INDEPENDENT_AMBULATORY_CARE_PROVIDER_SITE_OTHER): Payer: BLUE CROSS/BLUE SHIELD | Admitting: Family Medicine

## 2017-06-14 ENCOUNTER — Encounter: Payer: Self-pay | Admitting: Family Medicine

## 2017-06-14 VITALS — BP 122/88 | Temp 98.5°F | Ht 69.25 in | Wt 207.0 lb

## 2017-06-14 DIAGNOSIS — M109 Gout, unspecified: Secondary | ICD-10-CM | POA: Diagnosis not present

## 2017-06-14 DIAGNOSIS — Z79899 Other long term (current) drug therapy: Secondary | ICD-10-CM

## 2017-06-14 MED ORDER — COLCHICINE 0.6 MG PO TABS: 0.6000 mg | ORAL_TABLET | Freq: Two times a day (BID) | ORAL | 3 refills | Status: DC

## 2017-06-14 MED ORDER — INDOMETHACIN 50 MG PO CAPS: 50.0000 mg | ORAL_CAPSULE | Freq: Three times a day (TID) | ORAL | 4 refills | Status: AC | PRN

## 2017-06-14 NOTE — Progress Notes (Signed)
   Subjective:    Patient ID: Robert Brooks, male    DOB: [birth year], 57 y.o.   MRN: [id number]  HPI  Patient is here today with foot/heal pain.He states he has hx of gout and has recently went to the beach and had fried shrimp over the week while at the beach. Pt states he is not on any allopurinol or colchicine or any antigout med he is taking Ibuprofen and regulating with his diet. Patient has had been having multiple flareups on his left foot and right foot where here get reddened area is swollen area tender painful then there go down after a few days Patient states if he drinks a gallon of liquids before exercising he typically does not get this He has been trying to be healthy with his diet He also states blood pressure is been slightly elevated recently Review of Systems  Constitutional: Negative for activity change.  HENT: Negative for congestion and rhinorrhea.   Respiratory: Negative for cough and shortness of breath.   Cardiovascular: Negative for chest pain.  Gastrointestinal: Negative for abdominal pain, diarrhea, nausea and vomiting.  Genitourinary: Negative for dysuria and hematuria.  Neurological: Negative for weakness and headaches.  Psychiatric/Behavioral: Negative for confusion.       Objective:   Physical Exam  Constitutional: He appears well-nourished.  Cardiovascular: Normal rate, regular rhythm and normal heart sounds.  No murmur heard. Pulmonary/Chest: Effort normal and breath sounds normal.  Musculoskeletal: He exhibits no edema.  Lymphadenopathy:    He has no cervical adenopathy.  Neurological: He is alert.  Psychiatric: His behavior is normal.  Vitals reviewed. Inflammation on the posterior aspect of the right foot red and very tender        Assessment & Plan:  Borderline blood pressure Healthy diet recommended Regular physical activity recommended Follow-up for wellness checkup later this year  Probable gout- indomethacin 2-3 times per day  hold on the ibuprofen in addition to this colchicine 1 twice daily until under better control Lab work indicated Probably will need to be on allopurinol as well Hold off on this until we get lab work back

## 2017-06-15 ENCOUNTER — Other Ambulatory Visit: Payer: Self-pay | Admitting: Family Medicine

## 2017-06-15 DIAGNOSIS — M109 Gout, unspecified: Secondary | ICD-10-CM

## 2017-06-15 LAB — BASIC METABOLIC PANEL
BUN/Creatinine Ratio: 14 (ref 9–20)
BUN: 16 mg/dL (ref 6–24)
CHLORIDE: 105 mmol/L (ref 96–106)
CO2: 23 mmol/L (ref 20–29)
Calcium: 9.6 mg/dL (ref 8.7–10.2)
Creatinine, Ser: 1.11 mg/dL (ref 0.76–1.27)
GFR calc Af Amer: 86 mL/min/{1.73_m2} (ref >59)
GFR calc non Af Amer: 74 mL/min/{1.73_m2} (ref >59)
GLUCOSE: 87 mg/dL (ref 65–99)
POTASSIUM: 4.5 mmol/L (ref 3.5–5.2)
SODIUM: 144 mmol/L (ref 134–144)

## 2017-06-15 LAB — URIC ACID: URIC ACID: 6.4 mg/dL (ref 3.7–8.6)

## 2017-06-15 MED ORDER — ALLOPURINOL 100 MG PO TABS: 100.0000 mg | ORAL_TABLET | Freq: Every day | ORAL | 1 refills | Status: DC

## 2017-07-06 ENCOUNTER — Encounter: Payer: Self-pay | Admitting: Internal Medicine

## 2017-07-20 ENCOUNTER — Encounter: Payer: Self-pay | Admitting: Internal Medicine

## 2017-07-20 ENCOUNTER — Ambulatory Visit (AMBULATORY_SURGERY_CENTER): Payer: BLUE CROSS/BLUE SHIELD | Admitting: Internal Medicine

## 2017-07-20 ENCOUNTER — Other Ambulatory Visit: Payer: Self-pay

## 2017-07-20 VITALS — BP 130/59 | HR 57 | Temp 98.4°F | Resp 23 | Ht 70.0 in | Wt 207.0 lb

## 2017-07-20 DIAGNOSIS — D123 Benign neoplasm of transverse colon: Secondary | ICD-10-CM

## 2017-07-20 DIAGNOSIS — Z1211 Encounter for screening for malignant neoplasm of colon: Secondary | ICD-10-CM

## 2017-07-20 DIAGNOSIS — Z800 Family history of malignant neoplasm of digestive organs: Secondary | ICD-10-CM

## 2017-07-20 MED ORDER — SODIUM CHLORIDE 0.9 % IV SOLN: 500.0000 mL | Freq: Once | INTRAVENOUS | Status: DC

## 2017-07-20 NOTE — Progress Notes (Signed)
Called to room to assist during endoscopic procedure.  Patient ID and intended procedure confirmed with present staff. Received instructions for my participation in the procedure from the performing physician.  

## 2017-07-20 NOTE — Progress Notes (Signed)
A/ox3 pleased with MAC, report to [employer]

## 2017-07-20 NOTE — Op Note (Signed)
[facility] Patient Name: Robert Brooks Procedure Date: 07/20/2017 1:33 PM MRN: [id number] Endoscopist: Jerene Bears , MD Age: 57 Referring MD:  Date of Birth: Oct 29, 1960 Gender: Male Account #: [id number] Procedure:                Colonoscopy Indications:              Screening in patient at increased risk: Family                            history of 1st-degree relative with colorectal                            cancer, Last colonoscopy: 2013 Medicines:                Monitored Anesthesia Care Procedure:                Pre-Anesthesia Assessment:                           - Prior to the procedure, a History and Physical                            was performed, and patient medications and                            allergies were reviewed. The patient's tolerance of                            previous anesthesia was also reviewed. The risks                            and benefits of the procedure and the sedation                            options and risks were discussed with the patient.                            All questions were answered, and informed consent                            was obtained. Prior Anticoagulants: The patient has                            taken no previous anticoagulant or antiplatelet                            agents. ASA Grade Assessment: II - A patient with                            mild systemic disease. After reviewing the risks                            and benefits, the patient was deemed in  satisfactory condition to undergo the procedure.                           After obtaining informed consent, the colonoscope                            was passed under direct vision. Throughout the                            procedure, the patient's blood pressure, pulse, and                            oxygen saturations were monitored continuously. The                            Colonoscope was introduced through the  anus and                            advanced to the the cecum, identified by                            appendiceal orifice and ileocecal valve. The                            colonoscopy was performed without difficulty. The                            patient tolerated the procedure well. The quality                            of the bowel preparation was good. The ileocecal                            valve, appendiceal orifice, and rectum were                            photographed. Scope In: 1:45:23 PM Scope Out: 1:57:13 PM Scope Withdrawal Time: 0 hours 9 minutes 28 seconds  Total Procedure Duration: 0 hours 11 minutes 50 seconds  Findings:                 The digital rectal exam was normal.                           A 4 mm polyp was found in the transverse colon. The                            polyp was sessile. The polyp was removed with a                            cold snare. Resection and retrieval were complete.                           Multiple small-mouthed diverticula were found in  the sigmoid colon.                           Internal hemorrhoids were found during                            retroflexion. The hemorrhoids were small.                           The exam was otherwise without abnormality. Complications:            No immediate complications. Estimated Blood Loss:     Estimated blood loss was minimal. Impression:               - One 4 mm polyp in the transverse colon, removed                            with a cold snare. Resected and retrieved.                           - Diverticulosis in the sigmoid colon.                           - Small internal hemorrhoids.                           - The examination was otherwise normal. Recommendation:           - Patient has a contact number available for                            emergencies. The signs and symptoms of potential                            delayed complications were discussed  with the                            patient. Return to normal activities tomorrow.                            Written discharge instructions were provided to the                            patient.                           - Resume previous diet.                           - Continue present medications.                           - Await pathology results.                           - Repeat colonoscopy in 5 years. Jerene Bears, MD 07/20/2017 2:00:10 PM This report has been signed electronically.

## 2017-07-20 NOTE — Patient Instructions (Addendum)
YOU HAD AN ENDOSCOPIC PROCEDURE TODAY AT [facility]:   Refer to the procedure report that was given to you for any specific questions about what was found during the examination.  If the procedure report does not answer your questions, please call your gastroenterologist to clarify.  If you requested that your care partner not be given the details of your procedure findings, then the procedure report has been included in a sealed envelope for you to review at your convenience later.  YOU SHOULD EXPECT: Some feelings of bloating in the abdomen. Passage of more gas than usual.  Walking can help get rid of the air that was put into your GI tract during the procedure and reduce the bloating. If you had a lower endoscopy (such as a colonoscopy or flexible sigmoidoscopy) you may notice spotting of blood in your stool or on the toilet paper. If you underwent a bowel prep for your procedure, you may not have a normal bowel movement for a few days.  Please Note:  You might notice some irritation and congestion in your nose or some drainage.  This is from the oxygen used during your procedure.  There is no need for concern and it should clear up in a day or so.  SYMPTOMS TO REPORT IMMEDIATELY:   Following lower endoscopy (colonoscopy or flexible sigmoidoscopy):  Excessive amounts of blood in the stool  Significant tenderness or worsening of abdominal pains  Swelling of the abdomen that is new, acute  Fever of 100F or higher   For urgent or emergent issues, a gastroenterologist can be reached at any hour by calling [phone].   DIET:  We do recommend a small meal at first, but then you may proceed to your regular diet.  Drink plenty of fluids but you should avoid alcoholic beverages for 24 hours.  ACTIVITY:  You should plan to take it easy for the rest of today and you should NOT DRIVE or use heavy machinery until tomorrow (because of the sedation medicines used during the test).     FOLLOW UP: Our staff will call the number listed on your records the next business day following your procedure to check on you and address any questions or concerns that you may have regarding the information given to you following your procedure. If we do not reach you, we will leave a message.  However, if you are feeling well and you are not experiencing any problems, there is no need to return our call.  We will assume that you have returned to your regular daily activities without incident.  If any biopsies were taken you will be contacted by phone or by letter within the next 1-3 weeks.  Please call us at [phone] if you have not heard about the biopsies in 3 weeks.    SIGNATURES/CONFIDENTIALITY: You and/or your care partner have signed paperwork which will be entered into your electronic medical record.  These signatures attest to the fact that that the information above on your After Visit Summary has been reviewed and is understood.  Full responsibility of the confidentiality of this discharge information lies with you and/or your care-partner.  Polyp, diverticulosis,and hemorrhoid and hemorrhoid banding  information given.  Recall colonoscopy 5 years-2024

## 2017-07-21 ENCOUNTER — Telehealth: Payer: Self-pay | Admitting: *Deleted

## 2017-07-21 NOTE — Telephone Encounter (Signed)
  Follow up Call-  Call back number 07/20/2017  Post procedure Call Back phone  # [phone]  Permission to leave phone message Yes  Some recent data might be hidden     Patient questions:  Do you have a fever, pain , or abdominal swelling? No. Pain Score  0 *  Have you tolerated food without any problems? Yes.    Have you been able to return to your normal activities? Yes.    Do you have any questions about your discharge instructions: Diet   No. Medications  No. Follow up visit  No  Do you have questions or concerns about your Care? No.  Actions: * If pain score is 4 or above: No action needed, pain <4.

## 2017-07-21 NOTE — Telephone Encounter (Signed)
  Follow up Call-  Call back number 07/20/2017  Post procedure Call Back phone  # [phone]  Permission to leave phone message Yes  Some recent data might be hidden     Patient questions:  Message left to call us if necessary.

## 2017-07-25 ENCOUNTER — Encounter: Payer: Self-pay | Admitting: Internal Medicine

## 2017-08-11 ENCOUNTER — Other Ambulatory Visit: Payer: Self-pay | Admitting: Family Medicine

## 2017-08-18 DIAGNOSIS — M109 Gout, unspecified: Secondary | ICD-10-CM | POA: Diagnosis not present

## 2017-08-19 ENCOUNTER — Other Ambulatory Visit: Payer: Self-pay | Admitting: Family Medicine

## 2017-08-19 ENCOUNTER — Other Ambulatory Visit: Payer: Self-pay

## 2017-08-19 ENCOUNTER — Encounter: Payer: Self-pay | Admitting: Family Medicine

## 2017-08-19 LAB — URIC ACID: URIC ACID: 5.6 mg/dL (ref 3.7–8.6)

## 2017-08-19 MED ORDER — ALLOPURINOL 100 MG PO TABS: 100.0000 mg | ORAL_TABLET | Freq: Every day | ORAL | 1 refills | Status: DC

## 2017-08-19 MED ORDER — COLCHICINE 0.6 MG PO TABS: 0.6000 mg | ORAL_TABLET | Freq: Two times a day (BID) | ORAL | 4 refills | Status: DC

## 2017-08-19 NOTE — Telephone Encounter (Signed)
Pt was seen April 2nd. Pt saw the results on MyChart. Contacted patient and asked if I could help with his question. Pt stated that pharmacy could not refill med due to it saying "no refills". Nurse sent in 90 day supply via patient request.

## 2017-08-19 NOTE — Telephone Encounter (Signed)
Left message to return call 

## 2017-10-31 ENCOUNTER — Ambulatory Visit (INDEPENDENT_AMBULATORY_CARE_PROVIDER_SITE_OTHER): Payer: BLUE CROSS/BLUE SHIELD | Admitting: Family Medicine

## 2017-10-31 ENCOUNTER — Encounter: Payer: Self-pay | Admitting: Family Medicine

## 2017-10-31 VITALS — BP 132/90 | Ht 69.25 in | Wt 196.0 lb

## 2017-10-31 DIAGNOSIS — R0789 Other chest pain: Secondary | ICD-10-CM

## 2017-10-31 DIAGNOSIS — F439 Reaction to severe stress, unspecified: Secondary | ICD-10-CM

## 2017-10-31 DIAGNOSIS — Z125 Encounter for screening for malignant neoplasm of prostate: Secondary | ICD-10-CM | POA: Diagnosis not present

## 2017-10-31 DIAGNOSIS — R030 Elevated blood-pressure reading, without diagnosis of hypertension: Secondary | ICD-10-CM

## 2017-10-31 MED ORDER — AMLODIPINE BESYLATE 2.5 MG PO TABS: 2.5000 mg | ORAL_TABLET | Freq: Every day | ORAL | 3 refills | Status: DC

## 2017-10-31 NOTE — Patient Instructions (Signed)
DASH Eating Plan DASH stands for "Dietary Approaches to Stop Hypertension." The DASH eating plan is a healthy eating plan that has been shown to reduce high blood pressure (hypertension). It may also reduce your risk for type 2 diabetes, heart disease, and stroke. The DASH eating plan may also help with weight loss. What are tips for following this plan? General guidelines  Avoid eating more than 2,300 mg (milligrams) of salt (sodium) a day. If you have hypertension, you may need to reduce your sodium intake to 1,500 mg a day.  Limit alcohol intake to no more than 1 drink a day for nonpregnant women and 2 drinks a day for men. One drink equals 12 oz of beer, 5 oz of wine, or 1 oz of hard liquor.  Work with your health care provider to maintain a healthy body weight or to lose weight. Ask what an ideal weight is for you.  Get at least 30 minutes of exercise that causes your heart to beat faster (aerobic exercise) most days of the week. Activities may include walking, swimming, or biking.  Work with your health care provider or diet and nutrition specialist (dietitian) to adjust your eating plan to your individual calorie needs. Reading food labels  Check food labels for the amount of sodium per serving. Choose foods with less than 5 percent of the Daily Value of sodium. Generally, foods with less than 300 mg of sodium per serving fit into this eating plan.  To find whole grains, look for the word "whole" as the first word in the ingredient list. Shopping  Buy products labeled as "low-sodium" or "no salt added."  Buy fresh foods. Avoid canned foods and premade or frozen meals. Cooking  Avoid adding salt when cooking. Use salt-free seasonings or herbs instead of table salt or sea salt. Check with your health care provider or pharmacist before using salt substitutes.  Do not fry foods. Cook foods using healthy methods such as baking, boiling, grilling, and broiling instead.  Cook with  heart-healthy oils, such as olive, canola, soybean, or sunflower oil. Meal planning   Eat a balanced diet that includes: ? 5 or more servings of fruits and vegetables each day. At each meal, try to fill half of your plate with fruits and vegetables. ? Up to 6-8 servings of whole grains each day. ? Less than 6 oz of lean meat, poultry, or fish each day. A 3-oz serving of meat is about the same size as a deck of cards. One egg equals 1 oz. ? 2 servings of low-fat dairy each day. ? A serving of nuts, seeds, or beans 5 times each week. ? Heart-healthy fats. Healthy fats called Omega-3 fatty acids are found in foods such as flaxseeds and coldwater fish, like sardines, salmon, and mackerel.  Limit how much you eat of the following: ? Canned or prepackaged foods. ? Food that is high in trans fat, such as fried foods. ? Food that is high in saturated fat, such as fatty meat. ? Sweets, desserts, sugary drinks, and other foods with added sugar. ? Full-fat dairy products.  Do not salt foods before eating.  Try to eat at least 2 vegetarian meals each week.  Eat more home-cooked food and less restaurant, buffet, and fast food.  When eating at a restaurant, ask that your food be prepared with less salt or no salt, if possible. What foods are recommended? The items listed may not be a complete list. Talk with your dietitian about what   dietary choices are best for you. Grains Whole-grain or whole-wheat bread. Whole-grain or whole-wheat pasta. Brown rice. Oatmeal. Quinoa. Bulgur. Whole-grain and low-sodium cereals. Pita bread. Low-fat, low-sodium crackers. Whole-wheat flour tortillas. Vegetables Fresh or frozen vegetables (raw, steamed, roasted, or grilled). Low-sodium or reduced-sodium tomato and vegetable juice. Low-sodium or reduced-sodium tomato sauce and tomato paste. Low-sodium or reduced-sodium canned vegetables. Fruits All fresh, dried, or frozen fruit. Canned fruit in natural juice (without  added sugar). Meat and other protein foods Skinless chicken or turkey. Ground chicken or turkey. Pork with fat trimmed off. Fish and seafood. Egg whites. Dried beans, peas, or lentils. Unsalted nuts, nut butters, and seeds. Unsalted canned beans. Lean cuts of beef with fat trimmed off. Low-sodium, lean deli meat. Dairy Low-fat (1%) or fat-free (skim) milk. Fat-free, low-fat, or reduced-fat cheeses. Nonfat, low-sodium ricotta or cottage cheese. Low-fat or nonfat yogurt. Low-fat, low-sodium cheese. Fats and oils Soft margarine without trans fats. Vegetable oil. Low-fat, reduced-fat, or light mayonnaise and salad dressings (reduced-sodium). Canola, safflower, olive, soybean, and sunflower oils. Avocado. Seasoning and other foods Herbs. Spices. Seasoning mixes without salt. Unsalted popcorn and pretzels. Fat-free sweets. What foods are not recommended? The items listed may not be a complete list. Talk with your dietitian about what dietary choices are best for you. Grains Baked goods made with fat, such as croissants, muffins, or some breads. Dry pasta or rice meal packs. Vegetables Creamed or fried vegetables. Vegetables in a cheese sauce. Regular canned vegetables (not low-sodium or reduced-sodium). Regular canned tomato sauce and paste (not low-sodium or reduced-sodium). Regular tomato and vegetable juice (not low-sodium or reduced-sodium). Pickles. Olives. Fruits Canned fruit in a light or heavy syrup. Fried fruit. Fruit in cream or butter sauce. Meat and other protein foods Fatty cuts of meat. Ribs. Fried meat. Bacon. Sausage. Bologna and other processed lunch meats. Salami. Fatback. Hotdogs. Bratwurst. Salted nuts and seeds. Canned beans with added salt. Canned or smoked fish. Whole eggs or egg yolks. Chicken or turkey with skin. Dairy Whole or 2% milk, cream, and half-and-half. Whole or full-fat cream cheese. Whole-fat or sweetened yogurt. Full-fat cheese. Nondairy creamers. Whipped toppings.  Processed cheese and cheese spreads. Fats and oils Butter. Stick margarine. Lard. Shortening. Ghee. Bacon fat. Tropical oils, such as coconut, palm kernel, or palm oil. Seasoning and other foods Salted popcorn and pretzels. Onion salt, garlic salt, seasoned salt, table salt, and sea salt. Worcestershire sauce. Tartar sauce. Barbecue sauce. Teriyaki sauce. Soy sauce, including reduced-sodium. Steak sauce. Canned and packaged gravies. Fish sauce. Oyster sauce. Cocktail sauce. Horseradish that you find on the shelf. Ketchup. Mustard. Meat flavorings and tenderizers. Bouillon cubes. Hot sauce and Tabasco sauce. Premade or packaged marinades. Premade or packaged taco seasonings. Relishes. Regular salad dressings. Where to find more information:  National Heart, Lung, and Blood Institute: www.nhlbi.nih.gov  American Heart Association: www.heart.org Summary  The DASH eating plan is a healthy eating plan that has been shown to reduce high blood pressure (hypertension). It may also reduce your risk for type 2 diabetes, heart disease, and stroke.  With the DASH eating plan, you should limit salt (sodium) intake to 2,300 mg a day. If you have hypertension, you may need to reduce your sodium intake to 1,500 mg a day.  When on the DASH eating plan, aim to eat more fresh fruits and vegetables, whole grains, lean proteins, low-fat dairy, and heart-healthy fats.  Work with your health care provider or diet and nutrition specialist (dietitian) to adjust your eating plan to your individual   calorie needs. This information is not intended to replace advice given to you by your health care provider. Make sure you discuss any questions you have with your health care provider. Document Released: 02/18/2011 Document Revised: 02/23/2016 Document Reviewed: 02/23/2016 Elsevier Interactive Patient Education  2018 [employer]  

## 2017-10-31 NOTE — Progress Notes (Signed)
   Subjective:    Patient ID: Robert Brooks, male    DOB: 09/03/1960, 57 y.o.   MRN: [id number]  HPI Patient is here today to follow up on elevated bp for the last month or so.No headaches,some dizziness when getting out of [place] in the mornings.   He lost his sister in July and she was the primary caregiver to his mother and he is now stressing over that and has some heaviness in center of chest at times after dealing with her.   He has been exercising to help with the stress. Patient has gone through a lot of stress his mom is manipulative and very demanding.  She also needs constant caretaking The patient and his brother have hired a Actuary who is been doing the help for the mother The patient relates every time he is around his mother he gets heaviness in the center of her chest  When he plays golf or goes out walking for 2-1/2 miles for exercise he does not experience any type of chest tightness pressure pain  In addition to this has noticed his blood pressure is been up multiple times blood pressure cuff that he bought online has been reading very high blood pressure issues running family patient has been eating healthy losing weight      Review of Systems  Constitutional: Negative for diaphoresis and fatigue.  HENT: Negative for congestion and rhinorrhea.   Respiratory: Negative for cough and shortness of breath.   Cardiovascular: Negative for chest pain and leg swelling.  Gastrointestinal: Negative for abdominal pain and diarrhea.  Skin: Negative for color change and rash.  Neurological: Negative for dizziness and headaches.  Psychiatric/Behavioral: Negative for behavioral problems and confusion.       Objective:   Physical Exam  Constitutional: He appears well-nourished. No distress.  HENT:  Head: Normocephalic and atraumatic.  Eyes: Right eye exhibits no discharge. Left eye exhibits no discharge.  Neck: No tracheal deviation present.  Cardiovascular: Normal rate,  regular rhythm and normal heart sounds.  No murmur heard. Pulmonary/Chest: Effort normal and breath sounds normal. No respiratory distress.  Musculoskeletal: He exhibits no edema.  Lymphadenopathy:    He has no cervical adenopathy.  Neurological: He is alert. Coordination normal.  Skin: Skin is warm and dry.  Psychiatric: He has a normal mood and affect. His behavior is normal.  Vitals reviewed.  EKG there is some T wave inversions but they are not ischemic T waves.  ST segments look good  Blood pressure was checked multiple times this patient needs a large cuff the best reading was 132/90       Assessment & Plan:  We discussed blood pressure Discussed medications In addition to this healthy diet limiting salt and mild exercise  Abnormal EKG unlikely to be cardiac needs lab work I also feel the patient would benefit from seeing cardiology to have a stress test The patient states through his workplace they will be doing a stress test he thinks it will be in early September he is going to look into this and let me know if that is the case and that would be fine to do that  Patient will let us know sooner if any issues he will follow-up in several weeks he will bring a blood pressure monitor with him so we can recheck his blood pressure we await his lab work.  We did discuss mindfulness techniques to try to help him with his stress related issues

## 2017-11-01 DIAGNOSIS — R030 Elevated blood-pressure reading, without diagnosis of hypertension: Secondary | ICD-10-CM | POA: Diagnosis not present

## 2017-11-01 DIAGNOSIS — Z125 Encounter for screening for malignant neoplasm of prostate: Secondary | ICD-10-CM | POA: Diagnosis not present

## 2017-11-02 LAB — LIPID PANEL
CHOLESTEROL TOTAL: 193 mg/dL (ref 100–199)
Chol/HDL Ratio: 3.3 ratio (ref 0.0–5.0)
HDL: 58 mg/dL (ref >39)
LDL CALC: 111 mg/dL — AB (ref 0–99)
TRIGLYCERIDES: 121 mg/dL (ref 0–149)
VLDL Cholesterol Cal: 24 mg/dL (ref 5–40)

## 2017-11-02 LAB — BASIC METABOLIC PANEL
BUN/Creatinine Ratio: 17 (ref 9–20)
BUN: 21 mg/dL (ref 6–24)
CO2: 21 mmol/L (ref 20–29)
CREATININE: 1.21 mg/dL (ref 0.76–1.27)
Calcium: 9.3 mg/dL (ref 8.7–10.2)
Chloride: 106 mmol/L (ref 96–106)
GFR calc Af Amer: 77 mL/min/{1.73_m2} (ref >59)
GFR, EST NON AFRICAN AMERICAN: 67 mL/min/{1.73_m2} (ref >59)
Glucose: 99 mg/dL (ref 65–99)
Potassium: 4.7 mmol/L (ref 3.5–5.2)
Sodium: 141 mmol/L (ref 134–144)

## 2017-11-02 LAB — PSA: Prostate Specific Ag, Serum: 0.9 ng/mL (ref 0.0–4.0)

## 2017-11-04 ENCOUNTER — Encounter: Payer: Self-pay | Admitting: Family Medicine

## 2017-11-16 ENCOUNTER — Ambulatory Visit (INDEPENDENT_AMBULATORY_CARE_PROVIDER_SITE_OTHER): Payer: BLUE CROSS/BLUE SHIELD | Admitting: Family Medicine

## 2017-11-16 ENCOUNTER — Encounter: Payer: Self-pay | Admitting: Family Medicine

## 2017-11-16 VITALS — BP 118/78 | Ht 69.25 in | Wt 195.6 lb

## 2017-11-16 DIAGNOSIS — M109 Gout, unspecified: Secondary | ICD-10-CM

## 2017-11-16 DIAGNOSIS — I10 Essential (primary) hypertension: Secondary | ICD-10-CM | POA: Insufficient documentation

## 2017-11-16 MED ORDER — AMLODIPINE BESYLATE 2.5 MG PO TABS: 2.5000 mg | ORAL_TABLET | Freq: Every day | ORAL | 1 refills | Status: DC

## 2017-11-16 MED ORDER — ALLOPURINOL 100 MG PO TABS: 100.0000 mg | ORAL_TABLET | Freq: Every day | ORAL | 1 refills | Status: DC

## 2017-11-16 NOTE — Patient Instructions (Signed)

## 2017-11-16 NOTE — Progress Notes (Signed)
   Subjective:    Patient ID: Robert Brooks, male    DOB: 12-Mar-1961, 57 y.o.   MRN: [id number]  HPI Pt here 2 week follow up. Pt states he has not check his BP. Is not feeling any side effects from medication.  Blood pressure medicine working out well health working out well staying physically active denies any chest tightness pressure pain shortness of breath will be seen work specialist coming up they told him if his with his EKG being slightly abnormal they will be doing a stress test he will find out the parameters on this then he will let us know because he may want to have the stress test done locally with cardiologist  Review of Systems  Constitutional: Negative for activity change, fatigue and fever.  HENT: Negative for congestion and rhinorrhea.   Respiratory: Negative for cough and shortness of breath.   Cardiovascular: Negative for chest pain and leg swelling.  Gastrointestinal: Negative for abdominal pain, diarrhea and nausea.  Genitourinary: Negative for dysuria and hematuria.  Neurological: Negative for weakness and headaches.  Psychiatric/Behavioral: Negative for agitation and behavioral problems.       Objective:   Physical Exam  Constitutional: He appears well-nourished. No distress.  HENT:  Head: Normocephalic and atraumatic.  Eyes: Right eye exhibits no discharge. Left eye exhibits no discharge.  Neck: No tracheal deviation present.  Cardiovascular: Normal rate, regular rhythm and normal heart sounds.  No murmur heard. Pulmonary/Chest: Effort normal and breath sounds normal. No respiratory distress.  Musculoskeletal: He exhibits no edema.  Lymphadenopathy:    He has no cervical adenopathy.  Neurological: He is alert. Coordination normal.  Skin: Skin is warm and dry.  Psychiatric: He has a normal mood and affect. His behavior is normal.  Vitals reviewed.   He is requesting blood work regarding his gout medicine he will do this in the next few months     Assessment & Plan:  Blood pressure Watch diet Stay active Good control He will find out regarding the stress test see discussion above he will let us know Follow-up in 6 months

## 2017-12-13 ENCOUNTER — Encounter: Payer: Self-pay | Admitting: Family Medicine

## 2017-12-13 ENCOUNTER — Telehealth: Payer: Self-pay | Admitting: Family Medicine

## 2017-12-13 NOTE — Telephone Encounter (Signed)
This patient will need a office visit somewhere next week with me he is supposed to call to help set this up it is regarding his blood pressure thank you

## 2017-12-14 NOTE — Telephone Encounter (Signed)
Patient scheduled bp recheck appt 12/19/17.

## 2017-12-19 ENCOUNTER — Ambulatory Visit (INDEPENDENT_AMBULATORY_CARE_PROVIDER_SITE_OTHER): Payer: BLUE CROSS/BLUE SHIELD | Admitting: Family Medicine

## 2017-12-19 ENCOUNTER — Other Ambulatory Visit: Payer: Self-pay | Admitting: Family Medicine

## 2017-12-19 ENCOUNTER — Encounter: Payer: Self-pay | Admitting: Family Medicine

## 2017-12-19 VITALS — BP 128/74 | Ht 69.0 in | Wt 199.6 lb

## 2017-12-19 DIAGNOSIS — Z23 Encounter for immunization: Secondary | ICD-10-CM | POA: Diagnosis not present

## 2017-12-19 DIAGNOSIS — I10 Essential (primary) hypertension: Secondary | ICD-10-CM

## 2017-12-19 DIAGNOSIS — R0789 Other chest pain: Secondary | ICD-10-CM

## 2017-12-19 NOTE — Progress Notes (Signed)
Referral placed and patient is aware. Pt verbalized understanding.

## 2017-12-19 NOTE — Progress Notes (Signed)
   Subjective:    Patient ID: Robert Brooks, male    DOB: 1960-05-14, 57 y.o.   MRN: [id number]  Hypertension  Chronicity: follow up. Pertinent negatives include no chest pain, headaches or shortness of breath. Compliance problems include exercise (eats healthy, takes meds every day.).    This patient denies any chest tightness pressure pain or shortness of breath currently Blood pressure slightly elevated with a work physical therefore they have recommended that he check his blood pressure 3 separate times with [place] he does try to watch his diet he does not exercise as much as he should  Review of Systems  Constitutional: Negative for activity change, fatigue and fever.  HENT: Negative for congestion and rhinorrhea.   Respiratory: Negative for cough and shortness of breath.   Cardiovascular: Negative for chest pain and leg swelling.  Gastrointestinal: Negative for abdominal pain, diarrhea and nausea.  Genitourinary: Negative for dysuria and hematuria.  Neurological: Negative for weakness and headaches.  Psychiatric/Behavioral: Negative for agitation and behavioral problems.       Objective:   Physical Exam  Constitutional: He appears well-nourished. No distress.  HENT:  Head: Normocephalic and atraumatic.  Eyes: Right eye exhibits no discharge. Left eye exhibits no discharge.  Neck: No tracheal deviation present.  Cardiovascular: Normal rate, regular rhythm and normal heart sounds.  No murmur heard. Pulmonary/Chest: Effort normal and breath sounds normal. No respiratory distress.  Musculoskeletal: He exhibits no edema.  Lymphadenopathy:    He has no cervical adenopathy.  Neurological: He is alert. Coordination normal.  Skin: Skin is warm and dry.  Psychiatric: He has a normal mood and affect. His behavior is normal.  Vitals reviewed.   Blood pressure was checked multiple times best reading 128/86      Assessment & Plan:  HTN Continue current medication Minimize  salt Needs to increase physical activity on a regular basis Monitor blood pressures closely Recheck blood pressure on the next 2 days  Patient had some palpitations with subjective chest symptoms-please see dictation from October 31, 2017 patient would benefit from cardiology consult with stress test Patient in a very physical job that could require intense physical activity therefore stress test beneficial as a predictive test

## 2017-12-19 NOTE — Patient Instructions (Signed)
DASH Eating Plan DASH stands for "Dietary Approaches to Stop Hypertension." The DASH eating plan is a healthy eating plan that has been shown to reduce high blood pressure (hypertension). It may also reduce your risk for type 2 diabetes, heart disease, and stroke. The DASH eating plan may also help with weight loss. What are tips for following this plan? General guidelines  Avoid eating more than 2,300 mg (milligrams) of salt (sodium) a day. If you have hypertension, you may need to reduce your sodium intake to 1,500 mg a day.  Limit alcohol intake to no more than 1 drink a day for nonpregnant women and 2 drinks a day for men. One drink equals 12 oz of beer, 5 oz of wine, or 1 oz of hard liquor.  Work with your health care provider to maintain a healthy body weight or to lose weight. Ask what an ideal weight is for you.  Get at least 30 minutes of exercise that causes your heart to beat faster (aerobic exercise) most days of the week. Activities may include walking, swimming, or biking.  Work with your health care provider or diet and nutrition specialist (dietitian) to adjust your eating plan to your individual calorie needs. Reading food labels  Check food labels for the amount of sodium per serving. Choose foods with less than 5 percent of the Daily Value of sodium. Generally, foods with less than 300 mg of sodium per serving fit into this eating plan.  To find whole grains, look for the word "whole" as the first word in the ingredient list. Shopping  Buy products labeled as "low-sodium" or "no salt added."  Buy fresh foods. Avoid canned foods and premade or frozen meals. Cooking  Avoid adding salt when cooking. Use salt-free seasonings or herbs instead of table salt or sea salt. Check with your health care provider or pharmacist before using salt substitutes.  Do not fry foods. Cook foods using healthy methods such as baking, boiling, grilling, and broiling instead.  Cook with  heart-healthy oils, such as olive, canola, soybean, or sunflower oil. Meal planning   Eat a balanced diet that includes: ? 5 or more servings of fruits and vegetables each day. At each meal, try to fill half of your plate with fruits and vegetables. ? Up to 6-8 servings of whole grains each day. ? Less than 6 oz of lean meat, poultry, or fish each day. A 3-oz serving of meat is about the same size as a deck of cards. One egg equals 1 oz. ? 2 servings of low-fat dairy each day. ? A serving of nuts, seeds, or beans 5 times each week. ? Heart-healthy fats. Healthy fats called Omega-3 fatty acids are found in foods such as flaxseeds and coldwater fish, like sardines, salmon, and mackerel.  Limit how much you eat of the following: ? Canned or prepackaged foods. ? Food that is high in trans fat, such as fried foods. ? Food that is high in saturated fat, such as fatty meat. ? Sweets, desserts, sugary drinks, and other foods with added sugar. ? Full-fat dairy products.  Do not salt foods before eating.  Try to eat at least 2 vegetarian meals each week.  Eat more home-cooked food and less restaurant, buffet, and fast food.  When eating at a restaurant, ask that your food be prepared with less salt or no salt, if possible. What foods are recommended? The items listed may not be a complete list. Talk with your dietitian about what   dietary choices are best for you. Grains Whole-grain or whole-wheat bread. Whole-grain or whole-wheat pasta. Brown rice. Oatmeal. Quinoa. Bulgur. Whole-grain and low-sodium cereals. Pita bread. Low-fat, low-sodium crackers. Whole-wheat flour tortillas. Vegetables Fresh or frozen vegetables (raw, steamed, roasted, or grilled). Low-sodium or reduced-sodium tomato and vegetable juice. Low-sodium or reduced-sodium tomato sauce and tomato paste. Low-sodium or reduced-sodium canned vegetables. Fruits All fresh, dried, or frozen fruit. Canned fruit in natural juice (without  added sugar). Meat and other protein foods Skinless chicken or turkey. Ground chicken or turkey. Pork with fat trimmed off. Fish and seafood. Egg whites. Dried beans, peas, or lentils. Unsalted nuts, nut butters, and seeds. Unsalted canned beans. Lean cuts of beef with fat trimmed off. Low-sodium, lean deli meat. Dairy Low-fat (1%) or fat-free (skim) milk. Fat-free, low-fat, or reduced-fat cheeses. Nonfat, low-sodium ricotta or cottage cheese. Low-fat or nonfat yogurt. Low-fat, low-sodium cheese. Fats and oils Soft margarine without trans fats. Vegetable oil. Low-fat, reduced-fat, or light mayonnaise and salad dressings (reduced-sodium). Canola, safflower, olive, soybean, and sunflower oils. Avocado. Seasoning and other foods Herbs. Spices. Seasoning mixes without salt. Unsalted popcorn and pretzels. Fat-free sweets. What foods are not recommended? The items listed may not be a complete list. Talk with your dietitian about what dietary choices are best for you. Grains Baked goods made with fat, such as croissants, muffins, or some breads. Dry pasta or rice meal packs. Vegetables Creamed or fried vegetables. Vegetables in a cheese sauce. Regular canned vegetables (not low-sodium or reduced-sodium). Regular canned tomato sauce and paste (not low-sodium or reduced-sodium). Regular tomato and vegetable juice (not low-sodium or reduced-sodium). Pickles. Olives. Fruits Canned fruit in a light or heavy syrup. Fried fruit. Fruit in cream or butter sauce. Meat and other protein foods Fatty cuts of meat. Ribs. Fried meat. Bacon. Sausage. Bologna and other processed lunch meats. Salami. Fatback. Hotdogs. Bratwurst. Salted nuts and seeds. Canned beans with added salt. Canned or smoked fish. Whole eggs or egg yolks. Chicken or turkey with skin. Dairy Whole or 2% milk, cream, and half-and-half. Whole or full-fat cream cheese. Whole-fat or sweetened yogurt. Full-fat cheese. Nondairy creamers. Whipped toppings.  Processed cheese and cheese spreads. Fats and oils Butter. Stick margarine. Lard. Shortening. Ghee. Bacon fat. Tropical oils, such as coconut, palm kernel, or palm oil. Seasoning and other foods Salted popcorn and pretzels. Onion salt, garlic salt, seasoned salt, table salt, and sea salt. Worcestershire sauce. Tartar sauce. Barbecue sauce. Teriyaki sauce. Soy sauce, including reduced-sodium. Steak sauce. Canned and packaged gravies. Fish sauce. Oyster sauce. Cocktail sauce. Horseradish that you find on the shelf. Ketchup. Mustard. Meat flavorings and tenderizers. Bouillon cubes. Hot sauce and Tabasco sauce. Premade or packaged marinades. Premade or packaged taco seasonings. Relishes. Regular salad dressings. Where to find more information:  National Heart, Lung, and Blood Institute: www.nhlbi.nih.gov  American Heart Association: www.heart.org Summary  The DASH eating plan is a healthy eating plan that has been shown to reduce high blood pressure (hypertension). It may also reduce your risk for type 2 diabetes, heart disease, and stroke.  With the DASH eating plan, you should limit salt (sodium) intake to 2,300 mg a day. If you have hypertension, you may need to reduce your sodium intake to 1,500 mg a day.  When on the DASH eating plan, aim to eat more fresh fruits and vegetables, whole grains, lean proteins, low-fat dairy, and heart-healthy fats.  Work with your health care provider or diet and nutrition specialist (dietitian) to adjust your eating plan to your individual   calorie needs. This information is not intended to replace advice given to you by your health care provider. Make sure you discuss any questions you have with your health care provider. Document Released: 02/18/2011 Document Revised: 02/23/2016 Document Reviewed: 02/23/2016 Elsevier Interactive Patient Education  2018 [employer]  

## 2017-12-20 ENCOUNTER — Ambulatory Visit: Payer: BLUE CROSS/BLUE SHIELD | Admitting: Family Medicine

## 2017-12-20 ENCOUNTER — Ambulatory Visit (INDEPENDENT_AMBULATORY_CARE_PROVIDER_SITE_OTHER): Payer: BLUE CROSS/BLUE SHIELD | Admitting: Family Medicine

## 2017-12-20 VITALS — BP 128/86 | Ht 69.0 in | Wt 199.0 lb

## 2017-12-20 DIAGNOSIS — I10 Essential (primary) hypertension: Secondary | ICD-10-CM

## 2017-12-20 NOTE — Progress Notes (Signed)
   Subjective:    Patient ID: Robert Brooks, male    DOB: 04-19-1960, 57 y.o.   MRN: [id number]  HPI  Patient is here today for a repeat bp check. The patient here today for recheck of blood pressure Form was filled in Blood pressure acceptable 128/86 Review of Systems     Objective:   Physical Exam        Assessment & Plan:  Blood pressure acceptable He will continue current measures He will get the blood pressure rechecked again tomorrow Form filled out No charge for visit please see previous note from 12/19/2017

## 2017-12-21 ENCOUNTER — Ambulatory Visit (INDEPENDENT_AMBULATORY_CARE_PROVIDER_SITE_OTHER): Payer: BLUE CROSS/BLUE SHIELD | Admitting: Family Medicine

## 2017-12-21 VITALS — BP 122/86

## 2017-12-21 DIAGNOSIS — I10 Essential (primary) hypertension: Secondary | ICD-10-CM

## 2017-12-21 NOTE — Progress Notes (Signed)
Patient comes in blood pressure check Nurse checks his blood pressure Blood pressure overall very good This is required by his workplace to get 3 separate blood pressure readings earlier this week he was seen plus yesterday Blood pressure readings good follow-up for wellness exam later in the winter

## 2018-01-26 ENCOUNTER — Ambulatory Visit (INDEPENDENT_AMBULATORY_CARE_PROVIDER_SITE_OTHER): Payer: BLUE CROSS/BLUE SHIELD | Admitting: Cardiology

## 2018-01-26 ENCOUNTER — Encounter: Payer: Self-pay | Admitting: Cardiology

## 2018-01-26 ENCOUNTER — Encounter

## 2018-01-26 VITALS — BP 132/84 | HR 71 | Ht 70.0 in | Wt 203.0 lb

## 2018-01-26 DIAGNOSIS — R9431 Abnormal electrocardiogram [ECG] [EKG]: Secondary | ICD-10-CM

## 2018-01-26 DIAGNOSIS — R0789 Other chest pain: Secondary | ICD-10-CM | POA: Diagnosis not present

## 2018-01-26 NOTE — Patient Instructions (Signed)
Medication Instructions:  Your physician recommends that you continue on your current medications as directed. Please refer to the Current Medication list given to you today.  If you need a refill on your cardiac medications before your next appointment, please call your pharmacy.   Lab work: none If you have labs (blood work) drawn today and your tests are completely normal, you will receive your results only by: Marland Kitchen MyChart Message (if you have MyChart) OR . A paper copy in the mail If you have any lab test that is abnormal or we need to change your treatment, we will call you to review the results.  Testing/Procedures: none  Follow-Up: as needed with Dr.Branch   Any Other Special Instructions Will Be Listed Below (If Applicable).  DASH Eating Plan DASH stands for "Dietary Approaches to Stop Hypertension." The DASH eating plan is a healthy eating plan that has been shown to reduce high blood pressure (hypertension). It may also reduce your risk for type 2 diabetes, heart disease, and stroke. The DASH eating plan may also help with weight loss. What are tips for following this plan? General guidelines  Avoid eating more than 2,300 mg (milligrams) of salt (sodium) a day. If you have hypertension, you may need to reduce your sodium intake to 1,500 mg a day.  Limit alcohol intake to no more than 1 drink a day for nonpregnant women and 2 drinks a day for men. One drink equals 12 oz of beer, 5 oz of wine, or 1 oz of hard liquor.  Work with your health care provider to maintain a healthy body weight or to lose weight. Ask what an ideal weight is for you.  Get at least 30 minutes of exercise that causes your heart to beat faster (aerobic exercise) most days of the week. Activities may include walking, swimming, or biking.  Work with your health care provider or diet and nutrition specialist (dietitian) to adjust your eating plan to your individual calorie needs. Reading food  labels  Check food labels for the amount of sodium per serving. Choose foods with less than 5 percent of the Daily Value of sodium. Generally, foods with less than 300 mg of sodium per serving fit into this eating plan.  To find whole grains, look for the word "whole" as the first word in the ingredient list. Shopping  Buy products labeled as "low-sodium" or "no salt added."  Buy fresh foods. Avoid canned foods and premade or frozen meals. Cooking  Avoid adding salt when cooking. Use salt-free seasonings or herbs instead of table salt or sea salt. Check with your health care provider or pharmacist before using salt substitutes.  Do not fry foods. Cook foods using healthy methods such as baking, boiling, grilling, and broiling instead.  Cook with heart-healthy oils, such as olive, canola, soybean, or sunflower oil. Meal planning   Eat a balanced diet that includes: ? 5 or more servings of fruits and vegetables each day. At each meal, try to fill half of your plate with fruits and vegetables. ? Up to 6-8 servings of whole grains each day. ? Less than 6 oz of lean meat, poultry, or fish each day. A 3-oz serving of meat is about the same size as a deck of cards. One egg equals 1 oz. ? 2 servings of low-fat dairy each day. ? A serving of nuts, seeds, or beans 5 times each week. ? Heart-healthy fats. Healthy fats called Omega-3 fatty acids are found in foods such as  flaxseeds and coldwater fish, like sardines, salmon, and mackerel.  Limit how much you eat of the following: ? Canned or prepackaged foods. ? Food that is high in trans fat, such as fried foods. ? Food that is high in saturated fat, such as fatty meat. ? Sweets, desserts, sugary drinks, and other foods with added sugar. ? Full-fat dairy products.  Do not salt foods before eating.  Try to eat at least 2 vegetarian meals each week.  Eat more home-cooked food and less restaurant, buffet, and fast food.  When eating at a  restaurant, ask that your food be prepared with less salt or no salt, if possible. What foods are recommended? The items listed may not be a complete list. Talk with your dietitian about what dietary choices are best for you. Grains Whole-grain or whole-wheat bread. Whole-grain or whole-wheat pasta. Brown rice. [place]. Bulgur. Whole-grain and low-sodium cereals. Pita bread. Low-fat, low-sodium crackers. Whole-wheat flour tortillas. Vegetables Fresh or frozen vegetables (raw, steamed, roasted, or grilled). Low-sodium or reduced-sodium tomato and vegetable juice. Low-sodium or reduced-sodium tomato sauce and tomato paste. Low-sodium or reduced-sodium canned vegetables. Fruits All fresh, dried, or frozen fruit. Canned fruit in natural juice (without added sugar). Meat and other protein foods Skinless chicken or [place]. Ground chicken or [place]. Pork with fat trimmed off. Fish and seafood. Egg whites. Dried beans, peas, or lentils. Unsalted nuts, nut butters, and seeds. Unsalted canned beans. Lean cuts of beef with fat trimmed off. Low-sodium, lean deli meat. Dairy Low-fat (1%) or fat-free (skim) milk. Fat-free, low-fat, or reduced-fat cheeses. Nonfat, low-sodium ricotta or cottage cheese. Low-fat or nonfat yogurt. Low-fat, low-sodium cheese. Fats and oils Soft margarine without trans fats. Vegetable oil. Low-fat, reduced-fat, or light mayonnaise and salad dressings (reduced-sodium). Canola, safflower, olive, soybean, and sunflower oils. Avocado. Seasoning and other foods Herbs. Spices. Seasoning mixes without salt. Unsalted popcorn and pretzels. Fat-free sweets. What foods are not recommended? The items listed may not be a complete list. Talk with your dietitian about what dietary choices are best for you. Grains Baked goods made with fat, such as croissants, muffins, or some breads. Dry pasta or rice meal packs. Vegetables Creamed or fried vegetables. Vegetables in a cheese sauce.  Regular canned vegetables (not low-sodium or reduced-sodium). Regular canned tomato sauce and paste (not low-sodium or reduced-sodium). Regular tomato and vegetable juice (not low-sodium or reduced-sodium). Angie Fava. Olives. Fruits Canned fruit in a light or heavy syrup. Fried fruit. Fruit in cream or butter sauce. Meat and other protein foods Fatty cuts of meat. Ribs. Fried meat. Berniece Salines. Sausage. Bologna and other processed lunch meats. Salami. Fatback. Hotdogs. Bratwurst. Salted nuts and seeds. Canned beans with added salt. Canned or smoked fish. Whole eggs or egg yolks. Chicken or [place] with skin. Dairy Whole or 2% milk, cream, and half-and-half. Whole or full-fat cream cheese. Whole-fat or sweetened yogurt. Full-fat cheese. Nondairy creamers. Whipped toppings. Processed cheese and cheese spreads. Fats and oils Butter. Stick margarine. Lard. Shortening. Ghee. Bacon fat. Tropical oils, such as coconut, palm kernel, or palm oil. Seasoning and other foods Salted popcorn and pretzels. Onion salt, garlic salt, seasoned salt, table salt, and sea salt. Worcestershire sauce. Tartar sauce. Barbecue sauce. Teriyaki sauce. Soy sauce, including reduced-sodium. Steak sauce. Canned and packaged gravies. Fish sauce. Oyster sauce. Cocktail sauce. Horseradish that you find on the shelf. Ketchup. Mustard. Meat flavorings and tenderizers. Bouillon cubes. Hot sauce and Tabasco sauce. Premade or packaged marinades. Premade or packaged taco seasonings. Relishes. Regular salad dressings. Where  to find more information:  National Heart, Lung, and [place]: https://wilson-eaton.com/  American Heart Association: www.heart.org Summary  The DASH eating plan is a healthy eating plan that has been shown to reduce high blood pressure (hypertension). It may also reduce your risk for type 2 diabetes, heart disease, and stroke.  With the DASH eating plan, you should limit salt (sodium) intake to 2,300 mg a day. If you have  hypertension, you may need to reduce your sodium intake to 1,500 mg a day.  When on the DASH eating plan, aim to eat more fresh fruits and vegetables, whole grains, lean proteins, low-fat dairy, and heart-healthy fats.  Work with your health care provider or diet and nutrition specialist (dietitian) to adjust your eating plan to your individual calorie needs. This information is not intended to replace advice given to you by your health care provider. Make sure you discuss any questions you have with your health care provider. Document Released: 02/18/2011 Document Revised: 02/23/2016 Document Reviewed: 02/23/2016 Elsevier Interactive Patient Education  [employer].

## 2018-01-26 NOTE — Progress Notes (Signed)
Clinical Summary Robert Brooks is a 57 y.o.male seen as new consult, referred by Robert Brooks for chest pain. Last seen by our practice 5 years ago   1. Chest pain - episode in July, around that time extra stress related to loss of his sister and caring for his mother.  - sister passed in July, he has been taking care of his mother which is very stressful - heaviness midchest/epigastric, would be better with activies. Only came on with thinking about family. No SOB, no diaphoresis, no palpitations. Could have tightness in left arm. Could last all day long.  - has caretaker for mother now, stress has come down and symptoms have resolved - walks up to 1-2 miles without troubles.    2. Abnormal EKG - seen by Robert Brooks in 09/2012 - 09/2012 echo LVEF normal, no WMAs - 09/2012 nuclear stress no ischemia, exercised    SH: works as [place] [employer] Past Medical History:  Diagnosis Date  . Bursitis    left shoulder  . Gout      No Known Allergies   Current Outpatient Medications  Medication Sig Dispense Refill  . acetaminophen (TYLENOL) 325 MG tablet Take 650 mg by mouth every 6 (six) hours as needed.    Marland Kitchen allopurinol (ZYLOPRIM) 100 MG tablet Take 1 tablet (100 mg total) by mouth daily. 90 tablet 1  . amLODipine (NORVASC) 2.5 MG tablet Take 1 tablet (2.5 mg total) by mouth daily. 90 tablet 1  . colchicine 0.6 MG tablet Take 1 tablet (0.6 mg total) by mouth 2 (two) times daily. 30 tablet 4  . indomethacin (INDOCIN) 50 MG capsule Take 1 capsule (50 mg total) by mouth 3 (three) times daily as needed. 30 capsule 4  . Omega-3 Fatty Acids (FISH OIL) 1000 MG CAPS Take by mouth daily.     Current Facility-Administered Medications  Medication Dose Route Frequency Provider Last Rate Last Dose  . 0.9 %  sodium chloride infusion  500 mL Intravenous Once Pyrtle, Lajuan Lines, MD         Past Surgical History:  Procedure Laterality Date  . TONSILLECTOMY       No Known Allergies    Family  History  Problem Relation Age of Onset  . Colon cancer Mother 68  . Diabetes Mother   . Heart disease Father   . Diabetes Father   . Esophageal cancer Neg Hx   . Rectal cancer Neg Hx   . Stomach cancer Neg Hx      Social History Robert Brooks reports that he has never smoked. His smokeless tobacco use includes snuff. Robert Brooks reports that he drinks alcohol.   Review of Systems CONSTITUTIONAL: No weight loss, fever, chills, weakness or fatigue.  HEENT: Eyes: No visual loss, blurred vision, double vision or yellow sclerae.No hearing loss, sneezing, congestion, runny nose or sore throat.  SKIN: No rash or itching.  CARDIOVASCULAR: per hpi RESPIRATORY: No shortness of breath, cough or sputum.  GASTROINTESTINAL: No anorexia, nausea, vomiting or diarrhea. No abdominal pain or blood.  GENITOURINARY: No burning on urination, no polyuria NEUROLOGICAL: No headache, dizziness, syncope, paralysis, ataxia, numbness or tingling in the extremities. No change in bowel or bladder control.  MUSCULOSKELETAL: No muscle, back pain, joint pain or stiffness.  LYMPHATICS: No enlarged nodes. No history of splenectomy.  PSYCHIATRIC: No history of depression or anxiety.  ENDOCRINOLOGIC: No reports of sweating, cold or heat intolerance. No polyuria or polydipsia.  Marland Kitchen   Physical Examination  Vitals:   01/26/18 0829 01/26/18 0835  BP: 126/76 132/84  Pulse: 71   SpO2: 95%    Vitals:   01/26/18 0829  Weight: 203 lb (92.1 kg)  Height: 5\' 10"  (1.778 m)    Gen: resting comfortably, no acute distress HEENT: no scleral icterus, pupils equal round and reactive, no palptable cervical adenopathy,  CV: RRR, no mrg, no jvd Resp: Clear to auscultation bilaterally GI: abdomen is soft, non-tender, non-distended, normal bowel sounds, no hepatosplenomegaly MSK: extremities are warm, no edema.  Skin: warm, no rash Neuro:  no focal deficits Psych: appropriate affect    Assessment and Plan  1. Chest  pain - atypical symptoms in the setting of severe family stress/situation. Since family issues have improved his symptoms have completely resolved. Walks up to 2 miles without any exertional symptoms - chronically abnormal EKG with nonspecific ST/T changes with extensive cardiac workup in 2014 that was benign - no indication for further testing at this time  2. HTN - mild, overall at goal on low dose norvasc - counseled on DASH diet and weight loss, I think he could come off bp pills in the future if aggressive lifestyle modification   F/u as needed    Robert Brooks, M.D.

## 2018-02-06 DIAGNOSIS — Z1283 Encounter for screening for malignant neoplasm of skin: Secondary | ICD-10-CM | POA: Diagnosis not present

## 2018-02-06 DIAGNOSIS — L718 Other rosacea: Secondary | ICD-10-CM | POA: Diagnosis not present

## 2018-02-06 DIAGNOSIS — Z808 Family history of malignant neoplasm of other organs or systems: Secondary | ICD-10-CM | POA: Diagnosis not present

## 2018-02-06 DIAGNOSIS — L821 Other seborrheic keratosis: Secondary | ICD-10-CM | POA: Diagnosis not present

## 2018-02-17 DIAGNOSIS — M109 Gout, unspecified: Secondary | ICD-10-CM | POA: Diagnosis not present

## 2018-02-18 LAB — URIC ACID: URIC ACID: 6.3 mg/dL (ref 3.7–8.6)

## 2018-02-20 MED ORDER — ALLOPURINOL 100 MG PO TABS: 200.0000 mg | ORAL_TABLET | Freq: Every day | ORAL | 1 refills | Status: DC

## 2018-02-20 NOTE — Addendum Note (Signed)
Addended by: Dairl Ponder on: 02/20/2018 04:30 PM   Modules accepted: Orders

## 2018-06-12 DIAGNOSIS — M9905 Segmental and somatic dysfunction of pelvic region: Secondary | ICD-10-CM | POA: Diagnosis not present

## 2018-06-12 DIAGNOSIS — M9903 Segmental and somatic dysfunction of lumbar region: Secondary | ICD-10-CM | POA: Diagnosis not present

## 2018-06-12 DIAGNOSIS — M545 Low back pain: Secondary | ICD-10-CM | POA: Diagnosis not present

## 2018-06-12 DIAGNOSIS — M9902 Segmental and somatic dysfunction of thoracic region: Secondary | ICD-10-CM | POA: Diagnosis not present

## 2018-06-14 DIAGNOSIS — M9905 Segmental and somatic dysfunction of pelvic region: Secondary | ICD-10-CM | POA: Diagnosis not present

## 2018-06-14 DIAGNOSIS — M9902 Segmental and somatic dysfunction of thoracic region: Secondary | ICD-10-CM | POA: Diagnosis not present

## 2018-06-14 DIAGNOSIS — M9903 Segmental and somatic dysfunction of lumbar region: Secondary | ICD-10-CM | POA: Diagnosis not present

## 2018-06-14 DIAGNOSIS — M545 Low back pain: Secondary | ICD-10-CM | POA: Diagnosis not present

## 2018-08-01 ENCOUNTER — Other Ambulatory Visit: Payer: Self-pay | Admitting: Family Medicine

## 2018-08-01 NOTE — Telephone Encounter (Signed)
May have 90-day, needs follow-up office visit in the summer

## 2018-08-20 ENCOUNTER — Other Ambulatory Visit: Payer: Self-pay | Admitting: Family Medicine

## 2018-09-27 ENCOUNTER — Other Ambulatory Visit: Payer: Self-pay | Admitting: Family Medicine

## 2018-10-29 ENCOUNTER — Other Ambulatory Visit: Payer: Self-pay | Admitting: Family Medicine

## 2018-10-30 NOTE — Telephone Encounter (Signed)
Please contact patient and schedule appt. Then route back to nurses. Thank you

## 2018-10-30 NOTE — Telephone Encounter (Signed)
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

## 2018-11-01 NOTE — Telephone Encounter (Signed)
Called patient and sent mychart message reminding pt to call and schedule virtual appt

## 2018-11-07 NOTE — Telephone Encounter (Signed)
Pt has virtual 9/2

## 2018-11-15 ENCOUNTER — Ambulatory Visit (INDEPENDENT_AMBULATORY_CARE_PROVIDER_SITE_OTHER): Payer: BC Managed Care – PPO | Admitting: Family Medicine

## 2018-11-15 ENCOUNTER — Other Ambulatory Visit: Payer: Self-pay

## 2018-11-15 DIAGNOSIS — I10 Essential (primary) hypertension: Secondary | ICD-10-CM | POA: Diagnosis not present

## 2018-11-15 DIAGNOSIS — Z114 Encounter for screening for human immunodeficiency virus [HIV]: Secondary | ICD-10-CM

## 2018-11-15 DIAGNOSIS — M1000 Idiopathic gout, unspecified site: Secondary | ICD-10-CM | POA: Diagnosis not present

## 2018-11-15 DIAGNOSIS — Z1159 Encounter for screening for other viral diseases: Secondary | ICD-10-CM

## 2018-11-15 DIAGNOSIS — E7849 Other hyperlipidemia: Secondary | ICD-10-CM

## 2018-11-15 MED ORDER — AMLODIPINE BESYLATE 2.5 MG PO TABS: ORAL_TABLET | ORAL | 1 refills | Status: DC

## 2018-11-15 MED ORDER — ALLOPURINOL 100 MG PO TABS: ORAL_TABLET | ORAL | 1 refills | Status: DC

## 2018-11-15 NOTE — Progress Notes (Signed)
   Subjective:    Patient ID: Robert Brooks, male    DOB: 01-10-1961, 58 y.o.   MRN: [id number]  Hypertension This is a chronic problem. The current episode started more than 1 year ago. Pertinent negatives include no chest pain, headaches, palpitations or shortness of breath. Treatments tried: norvasc. The current treatment provides significant improvement. There are no compliance problems.  There is no history of angina, CVA, heart failure or retinopathy.   Patient does have underlying gout under good control watches diet tries to stay active and has been trying to lose weight  At some point time he is interested in the possibility of coming off blood pressure medicine if possible   Review of Systems  Constitutional: Negative for activity change, fatigue and fever.  HENT: Negative for congestion and rhinorrhea.   Respiratory: Negative for cough, shortness of breath and wheezing.   Cardiovascular: Negative for chest pain, palpitations and leg swelling.  Gastrointestinal: Negative for abdominal pain, diarrhea and nausea.  Genitourinary: Negative for dysuria and hematuria.  Neurological: Negative for weakness and headaches.  Psychiatric/Behavioral: Negative for agitation and behavioral problems.     Virtual Visit via Video Note  I connected with Susanne Borders on 11/15/18 at  1:10 PM EDT by a video enabled telemedicine application and verified that I am speaking with the correct person using two identifiers.  Location: Patient: home Provider: office   I discussed the limitations of evaluation and management by telemedicine and the availability of in person appointments. The patient expressed understanding and agreed to proceed.  History of Present Illness:    Observations/Objective:   Assessment and Plan:   Follow Up Instructions:    I discussed the assessment and treatment plan with the patient. The patient was provided an opportunity to ask questions and all were  answered. The patient agreed with the plan and demonstrated an understanding of the instructions.   The patient was advised to call back or seek an in-person evaluation if the symptoms worsen or if the condition fails to improve as anticipated.  I provided 15 minutes of non-face-to-face time during this encounter.        Objective:   Physical Exam Today's visit was via telephone Physical exam was not possible for this visit        Assessment & Plan:  Patient was under significant stress during the summer because of the health of his mother.  His mom is passed away he states that life is doing better at this moment  Blood pressure good control with medications watch his diet stays active takes his medicine  History hyperlipidemia does need lab work will need a complete wellness checkup in the fall he will schedule this  Gout takes his allopurinol regular basis watch his diet he will continue this

## 2018-12-13 DIAGNOSIS — I10 Essential (primary) hypertension: Secondary | ICD-10-CM | POA: Diagnosis not present

## 2018-12-13 DIAGNOSIS — Z125 Encounter for screening for malignant neoplasm of prostate: Secondary | ICD-10-CM | POA: Diagnosis not present

## 2018-12-13 DIAGNOSIS — E7849 Other hyperlipidemia: Secondary | ICD-10-CM | POA: Diagnosis not present

## 2018-12-13 DIAGNOSIS — M1000 Idiopathic gout, unspecified site: Secondary | ICD-10-CM | POA: Diagnosis not present

## 2018-12-13 DIAGNOSIS — Z114 Encounter for screening for human immunodeficiency virus [HIV]: Secondary | ICD-10-CM | POA: Diagnosis not present

## 2018-12-14 LAB — HEPATIC FUNCTION PANEL
ALT: 24 IU/L (ref 0–44)
AST: 27 IU/L (ref 0–40)
Albumin: 4.6 g/dL (ref 3.8–4.9)
Alkaline Phosphatase: 61 IU/L (ref 39–117)
Bilirubin Total: 0.5 mg/dL (ref 0.0–1.2)
Bilirubin, Direct: 0.16 mg/dL (ref 0.00–0.40)
Total Protein: 6.2 g/dL (ref 6.0–8.5)

## 2018-12-14 LAB — LIPID PANEL
Chol/HDL Ratio: 3.5 ratio (ref 0.0–5.0)
Cholesterol, Total: 209 mg/dL — ABNORMAL HIGH (ref 100–199)
HDL: 59 mg/dL (ref >39)
LDL Chol Calc (NIH): 132 mg/dL — ABNORMAL HIGH (ref 0–99)
Triglycerides: 100 mg/dL (ref 0–149)
VLDL Cholesterol Cal: 18 mg/dL (ref 5–40)

## 2018-12-14 LAB — BASIC METABOLIC PANEL
BUN/Creatinine Ratio: 17 (ref 9–20)
BUN: 18 mg/dL (ref 6–24)
CO2: 27 mmol/L (ref 20–29)
Calcium: 9.7 mg/dL (ref 8.7–10.2)
Chloride: 101 mmol/L (ref 96–106)
Creatinine, Ser: 1.08 mg/dL (ref 0.76–1.27)
GFR calc Af Amer: 88 mL/min/{1.73_m2} (ref >59)
GFR calc non Af Amer: 76 mL/min/{1.73_m2} (ref >59)
Glucose: 83 mg/dL (ref 65–99)
Potassium: 4.6 mmol/L (ref 3.5–5.2)
Sodium: 139 mmol/L (ref 134–144)

## 2018-12-14 LAB — HEPATITIS C ANTIBODY: Hep C Virus Ab: <0.1 s/co ratio (ref 0.0–0.9)

## 2018-12-14 LAB — HIV ANTIBODY (ROUTINE TESTING W REFLEX): HIV Screen 4th Generation wRfx: NONREACTIVE

## 2018-12-14 LAB — URIC ACID: Uric Acid: 5.4 mg/dL (ref 3.7–8.6)

## 2018-12-21 LAB — SPECIMEN STATUS REPORT

## 2018-12-25 ENCOUNTER — Ambulatory Visit (INDEPENDENT_AMBULATORY_CARE_PROVIDER_SITE_OTHER): Payer: BC Managed Care – PPO | Admitting: Family Medicine

## 2018-12-25 ENCOUNTER — Encounter: Payer: Self-pay | Admitting: Family Medicine

## 2018-12-25 ENCOUNTER — Other Ambulatory Visit: Payer: Self-pay

## 2018-12-25 VITALS — BP 124/80 | Temp 97.5°F | Ht 68.0 in | Wt 181.6 lb

## 2018-12-25 DIAGNOSIS — Z0000 Encounter for general adult medical examination without abnormal findings: Secondary | ICD-10-CM

## 2018-12-25 MED ORDER — COLCHICINE 0.6 MG PO TABS: ORAL_TABLET | ORAL | 4 refills | Status: DC

## 2018-12-25 MED ORDER — ALLOPURINOL 300 MG PO TABS: ORAL_TABLET | ORAL | 1 refills | Status: DC

## 2018-12-25 NOTE — Patient Instructions (Addendum)
Results for orders placed or performed in visit on 11/15/18  Lipid panel  Result Value Ref Range   Cholesterol, Total 209 (H) 100 - 199 mg/dL   Triglycerides 100 0 - 149 mg/dL   HDL 59 >39 mg/dL   VLDL Cholesterol Cal 18 5 - 40 mg/dL   LDL Chol Calc (NIH) 132 (H) 0 - 99 mg/dL   Chol/HDL Ratio 3.5 0.0 - 5.0 ratio  Basic metabolic panel  Result Value Ref Range   Glucose 83 65 - 99 mg/dL   BUN 18 6 - 24 mg/dL   Creatinine, Ser 1.08 0.76 - 1.27 mg/dL   GFR calc non Af Amer 76 >59 mL/min/1.73   GFR calc Af Amer 88 >59 mL/min/1.73   BUN/Creatinine Ratio 17 9 - 20   Sodium 139 134 - 144 mmol/L   Potassium 4.6 3.5 - 5.2 mmol/L   Chloride 101 96 - 106 mmol/L   CO2 27 20 - 29 mmol/L   Calcium 9.7 8.7 - 10.2 mg/dL  Hepatic function panel  Result Value Ref Range   Total Protein 6.2 6.0 - 8.5 g/dL   Albumin 4.6 3.8 - 4.9 g/dL   Bilirubin Total 0.5 0.0 - 1.2 mg/dL   Bilirubin, Direct 0.16 0.00 - 0.40 mg/dL   Alkaline Phosphatase 61 39 - 117 IU/L   AST 27 0 - 40 IU/L   ALT 24 0 - 44 IU/L  Uric acid  Result Value Ref Range   Uric Acid 5.4 3.7 - 8.6 mg/dL  HIV Antibody (routine testing w rflx)  Result Value Ref Range   HIV Screen 4th Generation wRfx Non Reactive Non Reactive  Hepatitis C Antibody  Result Value Ref Range   Hep C Virus Ab <0.1 0.0 - 0.9 s/co ratio  Specimen status report  Result Value Ref Range   specimen status report Comment   PSA  Result Value Ref Range   Prostate Specific Ag, Serum 0.7 0.0 - 4.0 ng/mL  Specimen status report  Result Value Ref Range   specimen status report Comment     Low-Purine Eating Plan A low-purine eating plan involves making food choices to limit your intake of purine. Purine is a kind of uric acid. Too much uric acid in your blood can cause certain conditions, such as gout and kidney stones. Eating a low-purine diet can help control these conditions. What are tips for following this plan? Reading food labels   Avoid foods with  saturated or Trans fat.  Check the ingredient list of grains-based foods, such as bread and cereal, to make sure that they contain whole grains.  Check the ingredient list of sauces or soups to make sure they do not contain meat or fish.  When choosing soft drinks, check the ingredient list to make sure they do not contain high-fructose corn syrup. Shopping  Buy plenty of fresh fruits and vegetables.  Avoid buying canned or fresh fish.  Buy dairy products labeled as low-fat or nonfat.  Avoid buying premade or processed foods. These foods are often high in fat, salt (sodium), and added sugar. Cooking  Use olive oil instead of butter when cooking. Oils like olive oil, canola oil, and sunflower oil contain healthy fats. Meal planning  Learn which foods do or do not affect you. If you find out that a food tends to cause your gout symptoms to flare up, avoid eating that food. You can enjoy foods that do not cause problems. If you have any questions about a  food item, talk with your dietitian or health care provider.  Limit foods high in fat, especially saturated fat. Fat makes it harder for your body to get rid of uric acid.  Choose foods that are lower in fat and are lean sources of protein. General guidelines  Limit alcohol intake to no more than 1 drink a day for nonpregnant women and 2 drinks a day for men. One drink equals 12 oz of beer, 5 oz of wine, or 1 oz of hard liquor. Alcohol can affect the way your body gets rid of uric acid.  Drink plenty of water to keep your urine clear or pale yellow. Fluids can help remove uric acid from your body.  If directed by your health care provider, take a vitamin C supplement.  Work with your health care provider and dietitian to develop a plan to achieve or maintain a healthy weight. Losing weight can help reduce uric acid in your blood. What foods are recommended? The items listed may not be a complete list. Talk with your dietitian about  what dietary choices are best for you. Foods low in purines Foods low in purines do not need to be limited. These include:  All fruits.  All low-purine vegetables, pickles, and olives.  Breads, pasta, rice, cornbread, and popcorn. Cake and other baked goods.  All dairy foods.  Eggs, nuts, and nut butters.  Spices and condiments, such as salt, herbs, and vinegar.  Plant oils, butter, and margarine.  Water, sugar-free soft drinks, tea, coffee, and cocoa.  Vegetable-based soups, broths, sauces, and gravies. Foods moderate in purines Foods moderate in purines should be limited to the amounts listed.   cup of asparagus, cauliflower, spinach, mushrooms, or green peas, each day.  2/3 cup uncooked oatmeal, each day.   cup dry wheat bran or wheat germ, each day.  2-3 ounces of meat or poultry, each day.  4-6 ounces of shellfish, such as crab, lobster, oysters, or shrimp, each day.  1 cup cooked beans, peas, or lentils, each day.  Soup, broths, or bouillon made from meat or fish. Limit these foods as much as possible. What foods are not recommended? The items listed may not be a complete list. Talk with your dietitian about what dietary choices are best for you. Limit your intake of foods high in purines, including:  Beer and other alcohol.  Meat-based gravy or sauce.  Canned or fresh fish, such as: ? Anchovies, sardines, herring, and tuna. ? Mussels and scallops. ? Codfish, trout, and haddock.  Berniece Salines.  Organ meats, such as: ? Liver or kidney. ? Tripe. ? Sweetbreads (thymus gland or pancreas).  Wild Clinical biochemist.  Yeast or yeast extract supplements.  Drinks sweetened with high-fructose corn syrup. Summary  Eating a low-purine diet can help control conditions caused by too much uric acid in the body, such as gout or kidney stones.  Choose low-purine foods, limit alcohol, and limit foods high in fat.  You will learn over time which foods do or do not affect  you. If you find out that a food tends to cause your gout symptoms to flare up, avoid eating that food. This information is not intended to replace advice given to you by your health care provider. Make sure you discuss any questions you have with your health care provider. Document Released: 06/26/2010 Document Revised: 02/11/2017 Document Reviewed: 04/14/2016 Elsevier Patient Education  2020 [employer].

## 2018-12-25 NOTE — Progress Notes (Signed)
   Subjective:    Patient ID: Robert Brooks, male    DOB: 1960/04/21, 58 y.o.   MRN: [id number]  HPI The patient comes in today for a wellness visit.  Past medical history hypertension hyperlipidemia and gout recently had an office visit for this  A review of their health history was completed.  A review of medications was also completed.  Any needed refills; 90 day refill on colchicine if possible.  Eating habits: tries to be healthy   Falls/  MVA accidents in past few months: none  Regular exercise: trying to but COVID has shut down the gyms  Specialist pt sees on regular basis: none  Preventative health issues were discussed.   Additional concerns: none   Review of Systems  Constitutional: Negative for diaphoresis and fatigue.  HENT: Negative for congestion and rhinorrhea.   Respiratory: Negative for cough and shortness of breath.   Cardiovascular: Negative for chest pain and leg swelling.  Gastrointestinal: Negative for abdominal pain and diarrhea.  Skin: Negative for color change and rash.  Neurological: Negative for dizziness and headaches.  Psychiatric/Behavioral: Negative for behavioral problems and confusion.       Objective:   Physical Exam Vitals signs reviewed.  Constitutional:      General: He is not in acute distress. HENT:     Head: Normocephalic and atraumatic.  Eyes:     General:        Right eye: No discharge.        Left eye: No discharge.  Neck:     Trachea: No tracheal deviation.  Cardiovascular:     Rate and Rhythm: Normal rate and regular rhythm.     Heart sounds: Normal heart sounds. No murmur.  Pulmonary:     Effort: Pulmonary effort is normal. No respiratory distress.     Breath sounds: Normal breath sounds.  Lymphadenopathy:     Cervical: No cervical adenopathy.  Skin:    General: Skin is warm and dry.  Neurological:     Mental Status: He is alert.     Coordination: Coordination normal.  Psychiatric:        Behavior:  Behavior normal.   Prostate exam normal        Assessment & Plan:  Adult wellness-complete.wellness physical was conducted today. Importance of diet and exercise were discussed in detail.  In addition to this a discussion regarding safety was also covered. We also reviewed over immunizations and gave recommendations regarding current immunization needed for age.  In addition to this additional areas were also touched on including: Preventative health exams needed:  Colonoscopy patient up-to-date 2020 for next 1  Patient was advised yearly wellness exam  Mild hyperlipidemia no need to be on medicine at this point watch diet  Blood pressure good control just previously seen  Gout-still good control but still has some flareups patient would like to try higher dose of allopurinol check labs in the spring

## 2018-12-26 ENCOUNTER — Encounter: Payer: BC Managed Care – PPO | Admitting: Family Medicine

## 2018-12-28 LAB — SPECIMEN STATUS REPORT

## 2018-12-28 LAB — PSA: Prostate Specific Ag, Serum: 0.7 ng/mL (ref 0.0–4.0)

## 2019-01-31 DIAGNOSIS — M9905 Segmental and somatic dysfunction of pelvic region: Secondary | ICD-10-CM | POA: Diagnosis not present

## 2019-01-31 DIAGNOSIS — M545 Low back pain: Secondary | ICD-10-CM | POA: Diagnosis not present

## 2019-01-31 DIAGNOSIS — M9903 Segmental and somatic dysfunction of lumbar region: Secondary | ICD-10-CM | POA: Diagnosis not present

## 2019-01-31 DIAGNOSIS — M9902 Segmental and somatic dysfunction of thoracic region: Secondary | ICD-10-CM | POA: Diagnosis not present

## 2019-04-09 ENCOUNTER — Encounter: Payer: Self-pay | Admitting: Family Medicine

## 2019-04-10 ENCOUNTER — Ambulatory Visit (INDEPENDENT_AMBULATORY_CARE_PROVIDER_SITE_OTHER): Payer: 59 | Admitting: Family Medicine

## 2019-04-10 ENCOUNTER — Other Ambulatory Visit: Payer: Self-pay

## 2019-04-10 DIAGNOSIS — R202 Paresthesia of skin: Secondary | ICD-10-CM | POA: Diagnosis not present

## 2019-04-10 NOTE — Progress Notes (Signed)
   Subjective:    Patient ID: Robert Brooks, male    DOB: July 08, 1960, 59 y.o.   MRN: [id number]  HPI  Patient calls to discuss tingling in right hand for 3 weeks- feels like it could be coming from shoulder. No true shoulder pain-wonders if it is a pinched nerve. Significantly tingling in the right hand feels like it is radiating at times up toward the elbow at times he feels like there could be a pinched nerve near the shoulder but denies any severe pain states the numbness is mainly in the index finger and thumb.  PMH benign because this is giving him more trouble and he would like to look into this currently Virtual Visit via Video Note  I connected with Susanne Borders on 04/10/19 at  3:00 PM EST by a video enabled telemedicine application and verified that I am speaking with the correct person using two identifiers.  Location: Patient: home Provider: office   I discussed the limitations of evaluation and management by telemedicine and the availability of in person appointments. The patient expressed understanding and agreed to proceed.  History of Present Illness:    Observations/Objective:   Assessment and Plan:   Follow Up Instructions:    I discussed the assessment and treatment plan with the patient. The patient was provided an opportunity to ask questions and all were answered. The patient agreed with the plan and demonstrated an understanding of the instructions.   The patient was advised to call back or seek an in-person evaluation if the symptoms worsen or if the condition fails to improve as anticipated.  I provided 16 minutes of non-face-to-face time during this encounter.     Review of Systems  Constitutional: Negative for activity change.  HENT: Negative for congestion and rhinorrhea.   Respiratory: Negative for cough and shortness of breath.   Cardiovascular: Negative for chest pain.  Gastrointestinal: Negative for abdominal pain, diarrhea, nausea and  vomiting.  Genitourinary: Negative for dysuria and hematuria.  Neurological: Positive for numbness. Negative for weakness and headaches.  Psychiatric/Behavioral: Negative for behavioral problems and confusion.       Objective:   Physical Exam Patient had virtual visit Appears to be in no distress Atraumatic Neuro able to relate and oriented No apparent resp distress Color normal Patient describes numbness in the index finger of the right hand sometimes middle finger       Assessment & Plan:  Numbness Needs further work-up Referral for nerve conduction study and evaluation Possible impingement of the nerve in the neck also possibility of numbness associated with an impingement in the elbow or wrist

## 2019-04-11 ENCOUNTER — Encounter: Payer: Self-pay | Admitting: Family Medicine

## 2019-04-11 NOTE — Addendum Note (Signed)
Addended by: Vicente Males on: 04/11/2019 03:06 PM   Modules accepted: Orders

## 2019-04-11 NOTE — Progress Notes (Signed)
Referral to neurology placed in Epic

## 2019-04-16 ENCOUNTER — Encounter: Payer: Self-pay | Admitting: Neurology

## 2019-04-16 ENCOUNTER — Ambulatory Visit: Payer: 59 | Admitting: Neurology

## 2019-04-16 ENCOUNTER — Other Ambulatory Visit: Payer: Self-pay

## 2019-04-16 VITALS — BP 138/88 | HR 58 | Temp 97.5°F | Ht 68.0 in | Wt 200.5 lb

## 2019-04-16 DIAGNOSIS — R202 Paresthesia of skin: Secondary | ICD-10-CM | POA: Diagnosis not present

## 2019-04-16 NOTE — Progress Notes (Signed)
PATIENT: Robert Brooks DOB: 1960/09/08  Chief Complaint  Patient presents with  . Numbness    Reports intermittent tingling in his right hand and forearm. Occasional numbness in fingers 2,3 and 4. His right upper arm feels like the muscle is fatigued.  Marland Kitchen PCP    Kathyrn Drown, MD     HISTORICAL  Robert Brooks is a 59 year old male, seen in request by his primary care physician Dr. Wolfgang Phoenix, Nicki Reaper for evaluation of numbness in his right hand and arm, initial evaluation was on April 16, 2019.  I have reviewed and summarized the referring note from the referring physician.  He has past medical history of hypertension, gout,  Around beginning of January 2021, there was no clear triggers, he began to notice intermittent radiating numbness from right shoulder to right lateral arm, sometimes extending to right second and fourth fingers dorsum surface, the transient paresthesia only lasting for few minutes, he has no weakness, no significant pain, he denies no significant neck pain, but complains of frequent catching of the right shoulder region, denies right shoulder pain  He denies gait difficulty no left arm involvement, Laboratory evaluation September 2020, negative hepatitis C, HIV, uric acid 5.4, LDL 132, total cholesterol 209, no CMP, creatinine of 1.08, normal PSA of 0.7,   REVIEW OF SYSTEMS: Full 14 system review of systems performed and notable only for as above All other review of systems were negative.  ALLERGIES: No Known Allergies  HOME MEDICATIONS: Current Outpatient Medications  Medication Sig Dispense Refill  . acetaminophen (TYLENOL) 325 MG tablet Take 650 mg by mouth every 6 (six) hours as needed.    Marland Kitchen allopurinol (ZYLOPRIM) 300 MG tablet TAKE 2 TABLETS(200 MG) BY MOUTH DAILY 90 tablet 1  . amLODipine (NORVASC) 2.5 MG tablet TAKE 1 TABLET(2.5 MG) BY MOUTH DAILY 90 tablet 1  . colchicine 0.6 MG tablet TAKE 1 TABLET(0.6 MG) BY MOUTH TWICE DAILY PRN Gout 90  tablet 4  . indomethacin (INDOCIN) 50 MG capsule Take 1 capsule (50 mg total) by mouth 3 (three) times daily as needed. 30 capsule 4   No current facility-administered medications for this visit.    PAST MEDICAL HISTORY: Past Medical History:  Diagnosis Date  . Bursitis    left shoulder  . Gout   . Hypertension   . Tingling     PAST SURGICAL HISTORY: Past Surgical History:  Procedure Laterality Date  . TONSILLECTOMY      FAMILY HISTORY: Family History  Problem Relation Age of Onset  . Colon cancer Mother 79  . Diabetes Mother   . Heart disease Father   . Diabetes Father   . Congestive Heart Failure Father   . Esophageal cancer Neg Hx   . Rectal cancer Neg Hx   . Stomach cancer Neg Hx     SOCIAL HISTORY: Social History   Socioeconomic History  . Marital status: Married    Spouse name: Not on file  . Number of children: 2  . Years of education: college  . Highest education level: Not on file  Occupational History  . Occupation: Event organiser  Tobacco Use  . Smoking status: Never Smoker  . Smokeless tobacco: Current User    Types: Snuff  Substance and Sexual Activity  . Alcohol use: Yes    Comment: socially  . Drug use: No  . Sexual activity: Not on file  Other Topics Concern  . Not on file  Social History Narrative   Lives  at home with his wife.   Right-handed.   Two cups caffeine per day.   Social Determinants of Health   Financial Resource Strain:   . Difficulty of Paying Living Expenses: Not on file  Food Insecurity:   . Worried About Charity fundraiser in the Last Year: Not on file  . Ran Out of Food in the Last Year: Not on file  Transportation Needs:   . Lack of Transportation (Medical): Not on file  . Lack of Transportation (Non-Medical): Not on file  Physical Activity:   . Days of Exercise per Week: Not on file  . Minutes of Exercise per Session: Not on file  Stress:   . Feeling of Stress : Not on file  Social Connections:   .  Frequency of Communication with Friends and Family: Not on file  . Frequency of Social Gatherings with Friends and Family: Not on file  . Attends Religious Services: Not on file  . Active Member of Clubs or Organizations: Not on file  . Attends Archivist Meetings: Not on file  . Marital Status: Not on file  Intimate Partner Violence:   . Fear of Current or Ex-Partner: Not on file  . Emotionally Abused: Not on file  . Physically Abused: Not on file  . Sexually Abused: Not on file     PHYSICAL EXAM   Vitals:   04/16/19 1534  BP: 138/88  Pulse: (!) 58  Temp: (!) 97.5 F (36.4 C)  Weight: 200 lb 8 oz (90.9 kg)  Height: 5\' 8"  (1.727 m)    Not recorded      Body mass index is 30.49 kg/m.  PHYSICAL EXAMNIATION:  Gen: NAD, conversant, well nourised, well groomed                     Cardiovascular: Regular rate rhythm, no peripheral edema, warm, nontender. Eyes: Conjunctivae clear without exudates or hemorrhage Neck: Supple, no carotid bruits. Pulmonary: Clear to auscultation bilaterally   NEUROLOGICAL EXAM:  MENTAL STATUS: Speech:    Speech is normal; fluent and spontaneous with normal comprehension.  Cognition:     Orientation to time, place and person     Normal recent and remote memory     Normal Attention span and concentration     Normal Language, naming, repeating,spontaneous speech     Fund of knowledge   CRANIAL NERVES: CN II: Visual fields are full to confrontation. Pupils are round equal and briskly reactive to light. CN III, IV, VI: extraocular movement are normal. No ptosis. CN V: Facial sensation is intact to light touch CN VII: Face is symmetric with normal eye closure  CN VIII: Hearing is normal to causal conversation. CN IX, X: Phonation is normal. CN XI: Head turning and shoulder shrug are intact  MOTOR:  He has mild right shoulder abduction, external rotation weakness,   REFLEXES: Reflexes are 2+ and symmetric at the biceps,  triceps, knees, and ankles. Plantar responses are flexor.  SENSORY: Intact to light touch, pinprick and vibratory sensation are intact in fingers and toes.  COORDINATION: There is no trunk or limb dysmetria noted.  GAIT/STANCE: Posture is normal. Gait is steady with normal steps, base, arm swing, and turning. Heel and toe walking are normal. Tandem gait is normal.  Romberg is absent.   DIAGNOSTIC DATA (LABS, IMAGING, TESTING) - I reviewed patient records, labs, notes, testing and imaging myself where available.   ASSESSMENT AND PLAN  Robert Brooks is  a 59 y.o. male   Right arm mild proximal muscle weakness  Involving right C5, C6 myotomes  Differentiation diagnosis including right cervical radiculopathy  Proceed with MRI of cervical spine  EMG nerve conduction study   Marcial Pacas, M.D. Ph.D.  [facility] [address], [place], [place] [address] Ph: [phone] Fax: [phone]  CC: Kathyrn Drown, MD

## 2019-05-07 ENCOUNTER — Other Ambulatory Visit: Payer: Self-pay | Admitting: *Deleted

## 2019-05-08 MED ORDER — AMLODIPINE BESYLATE 2.5 MG PO TABS: ORAL_TABLET | ORAL | 0 refills | Status: DC

## 2019-05-08 NOTE — Telephone Encounter (Signed)
Please schedule visit and then route back to nurses to send in refills

## 2019-05-08 NOTE — Telephone Encounter (Signed)
This was done.

## 2019-05-08 NOTE — Telephone Encounter (Signed)
Scheduled 5/3

## 2019-05-08 NOTE — Telephone Encounter (Signed)
May have 90-day needs follow-up office visit or virtual visit in the spring

## 2019-05-23 ENCOUNTER — Encounter: Payer: 59 | Admitting: Neurology

## 2019-05-23 ENCOUNTER — Other Ambulatory Visit: Payer: Self-pay

## 2019-05-23 ENCOUNTER — Ambulatory Visit (INDEPENDENT_AMBULATORY_CARE_PROVIDER_SITE_OTHER): Payer: 59 | Admitting: Neurology

## 2019-05-23 DIAGNOSIS — R202 Paresthesia of skin: Secondary | ICD-10-CM | POA: Diagnosis not present

## 2019-05-23 DIAGNOSIS — Z0289 Encounter for other administrative examinations: Secondary | ICD-10-CM

## 2019-05-23 NOTE — Procedures (Signed)
        Full Name: Robert Brooks Gender: Male MRN #: [id number] Date of Birth: [birth year]    Visit Date: 05/23/2019 07:22 Age: 59 Years Examining Physician: Marcial Pacas, MD  Referring Physician: Sallee Lange, MD History: 59 year old male presented with intermittent right finger and arm paresthesia  Summary of the tests: Nerve conduction study: Right median, ulnar sensory and motor responses were normal.  Electromyography: Selected needle examination of right upper extremity and right cervical paraspinal muscles were normal.  Conclusion: This is a normal study.  There is no electrodiagnostic evidence of right upper extremity neuropathy or right cervical radiculopathy.    ------------------------------- Marcial Pacas, M.D. PhD  [facility] [place], [place] [address] Tel: [phone] Fax: [phone]         [facility]    Nerve / Sites Muscle Latency Ref. Amplitude Ref. Rel Amp Segments Distance Velocity Ref. Area    ms ms mV mV %  cm m/s m/s mVms  R Median - APB     Wrist APB 3.7 ?4.4 8.0 ?4.0 100 Wrist - APB 7   32.4     Upper arm APB 8.6  7.9  97.8 Upper arm - Wrist 25 50 ?49 31.4  R Ulnar - ADM     Wrist ADM 3.3 ?3.3 10.5 ?6.0 100 Wrist - ADM 7   32.4     B.Elbow ADM 7.6  9.3  88.5 B.Elbow - Wrist 23 53 ?49 29.0     A.Elbow ADM 9.6  8.4  90.6 A.Elbow - B.Elbow 10 50 ?49 28.1         A.Elbow - Wrist             SNC    Nerve / Sites Rec. Site Peak Lat Ref.  Amp Ref. Segments Distance Peak Diff Ref.    ms ms V V  cm ms ms  R Median, Ulnar - Transcarpal comparison     Median Palm Wrist 2.1 ?2.2 77 ?35 Median Palm - Wrist 8       Ulnar Palm Wrist 2.0 ?2.2 20 ?12 Ulnar Palm - Wrist 8          Median Palm - Ulnar Palm  0.1 ?0.4  R Median - Orthodromic (Dig II, Mid palm)     Dig II Wrist 3.2 ?3.4 12 ?10 Dig II - Wrist 13    R Ulnar - Orthodromic, (Dig V, Mid palm)     Dig V Wrist 2.8 ?3.1 9 ?5 Dig V - Wrist 5             F  Wave    Nerve F  Lat Ref.   ms ms  R Ulnar - ADM 31.9 ?32.0       EMG Summary Table    Spontaneous MUAP Recruitment  Muscle IA Fib PSW Fasc Other Amp Dur. Poly Pattern  R. First dorsal interosseous Normal None None None _______ Normal Normal Normal Normal  R. Pronator teres Normal None None None _______ Normal Normal Normal Normal  R. Biceps brachii Normal None None None _______ Normal Normal Normal Normal  R. Deltoid Normal None None None _______ Normal Normal Normal Normal  R. Triceps brachii Normal None None None _______ Normal Normal Normal Normal  R. Cervical paraspinals Normal None None None _______ Normal Normal Normal Normal

## 2019-07-16 ENCOUNTER — Encounter: Payer: Self-pay | Admitting: Family Medicine

## 2019-07-16 ENCOUNTER — Other Ambulatory Visit: Payer: Self-pay

## 2019-07-16 ENCOUNTER — Telehealth: Payer: Self-pay | Admitting: *Deleted

## 2019-07-16 ENCOUNTER — Ambulatory Visit (INDEPENDENT_AMBULATORY_CARE_PROVIDER_SITE_OTHER): Payer: 59 | Admitting: Family Medicine

## 2019-07-16 DIAGNOSIS — I10 Essential (primary) hypertension: Secondary | ICD-10-CM

## 2019-07-16 DIAGNOSIS — Z125 Encounter for screening for malignant neoplasm of prostate: Secondary | ICD-10-CM | POA: Diagnosis not present

## 2019-07-16 DIAGNOSIS — E7849 Other hyperlipidemia: Secondary | ICD-10-CM

## 2019-07-16 DIAGNOSIS — M1000 Idiopathic gout, unspecified site: Secondary | ICD-10-CM | POA: Diagnosis not present

## 2019-07-16 DIAGNOSIS — Z79899 Other long term (current) drug therapy: Secondary | ICD-10-CM

## 2019-07-16 MED ORDER — ZOSTER VAC RECOMB ADJUVANTED 50 MCG/0.5ML IM SUSR: 0.5000 mL | Freq: Once | INTRAMUSCULAR | 1 refills | Status: AC

## 2019-07-16 MED ORDER — AMLODIPINE BESYLATE 2.5 MG PO TABS: ORAL_TABLET | ORAL | 1 refills | Status: DC

## 2019-07-16 MED ORDER — ALLOPURINOL 300 MG PO TABS: ORAL_TABLET | ORAL | 1 refills | Status: DC

## 2019-07-16 NOTE — Progress Notes (Signed)
   Subjective:    Patient ID: Robert Brooks, male    DOB: [birth year], 59 y.o.   MRN: [id number]  Hypertension This is a chronic problem. Treatments tried: amlodipine. Compliance problems: takes med every day, eats healthy, walks for exercise.    pt would like a 90 day supply on meds.  Pt states no concerns today.  He does relatively good job watching his diet staying physically active.  He states he is going to start doing walking more often along with some jogging he denies any setbacks tries to minimize salt in the diet tries to be smart about his dietary intake.  Does take his medicines on a regular basis has not had any flareups of gout recently Virtual Visit via Telephone Note  I connected with Susanne Borders on 07/16/19 at  1:10 PM EDT by telephone and verified that I am speaking with the correct person using two identifiers.  Location: Patient: home Provider: office   I discussed the limitations, risks, security and privacy concerns of performing an evaluation and management service by telephone and the availability of in person appointments. I also discussed with the patient that there may be a patient responsible charge related to this service. The patient expressed understanding and agreed to proceed.   History of Present Illness:    Observations/Objective:   Assessment and Plan:   Follow Up Instructions:    I discussed the assessment and treatment plan with the patient. The patient was provided an opportunity to ask questions and all were answered. The patient agreed with the plan and demonstrated an understanding of the instructions.   The patient was advised to call back or seek an in-person evaluation if the symptoms worsen or if the condition fails to improve as anticipated.  I provided 20 minutes of non-face-to-face time during this encounter.     Review of Systems Denies any type of chest tightness pressure pain shortness of breath states weight  fluctuates between 180 and 190    Objective:   Physical Exam  Today's visit was via telephone Physical exam was not possible for this visit       Assessment & Plan:  1. Essential hypertension Blood pressure reportedly under good control continue current meds is minimize salt stay physically active take medication  2. Acute idiopathic gout, unspecified site Has not had any flareups recently.  Does need a recheck uric acid level.  Continue allopurinol on a daily basis  3. Other hyperlipidemia Cholesterol slightly up on the last time patient is watching diet check labs again  Patient will do a wellness this summer  Shingrix vaccine recommended prescription will be sent to patient

## 2019-07-16 NOTE — Telephone Encounter (Signed)
Robert Brooks, Robert Brooks are scheduled for a virtual visit with your provider today.    Just as we do with appointments in the office, we must obtain your consent to participate.  Your consent will be active for this visit and any virtual visit you may have with one of our providers in the next 365 days.    If you have a MyChart account, I can also send a copy of this consent to you electronically.  All virtual visits are billed to your insurance company just like a traditional visit in the office.  As this is a virtual visit, video technology does not allow for your provider to perform a traditional examination.  This may limit your provider's ability to fully assess your condition.  If your provider identifies any concerns that need to be evaluated in person or the need to arrange testing such as labs, EKG, etc, we will make arrangements to do so.    Although advances in technology are sophisticated, we cannot ensure that it will always work on either your end or our end.  If the connection with a video visit is poor, we may have to switch to a telephone visit.  With either a video or telephone visit, we are not always able to ensure that we have a secure connection.   I need to obtain your verbal consent now.   Are you willing to proceed with your visit today?   Robert Brooks has provided verbal consent on 07/16/2019 for a virtual visit (video or telephone).   Dayton Bailiff, LPN [id number]  [id number] PM

## 2019-08-24 LAB — HEPATIC FUNCTION PANEL
ALT: 21 IU/L (ref 0–44)
AST: 20 IU/L (ref 0–40)
Albumin: 4.6 g/dL (ref 3.8–4.9)
Alkaline Phosphatase: 64 IU/L (ref 48–121)
Bilirubin Total: 0.4 mg/dL (ref 0.0–1.2)
Bilirubin, Direct: 0.11 mg/dL (ref 0.00–0.40)
Total Protein: 6.3 g/dL (ref 6.0–8.5)

## 2019-08-24 LAB — LIPID PANEL
Chol/HDL Ratio: 3.2 ratio (ref 0.0–5.0)
Cholesterol, Total: 222 mg/dL — ABNORMAL HIGH (ref 100–199)
HDL: 70 mg/dL (ref >39)
LDL Chol Calc (NIH): 132 mg/dL — ABNORMAL HIGH (ref 0–99)
Triglycerides: 113 mg/dL (ref 0–149)
VLDL Cholesterol Cal: 20 mg/dL (ref 5–40)

## 2019-08-24 LAB — BASIC METABOLIC PANEL
BUN/Creatinine Ratio: 18 (ref 9–20)
BUN: 20 mg/dL (ref 6–24)
CO2: 24 mmol/L (ref 20–29)
Calcium: 9.1 mg/dL (ref 8.7–10.2)
Chloride: 104 mmol/L (ref 96–106)
Creatinine, Ser: 1.13 mg/dL (ref 0.76–1.27)
GFR calc Af Amer: 83 mL/min/{1.73_m2} (ref >59)
GFR calc non Af Amer: 72 mL/min/{1.73_m2} (ref >59)
Glucose: 86 mg/dL (ref 65–99)
Potassium: 4.6 mmol/L (ref 3.5–5.2)
Sodium: 140 mmol/L (ref 134–144)

## 2019-08-24 LAB — PSA: Prostate Specific Ag, Serum: 1.5 ng/mL (ref 0.0–4.0)

## 2019-08-24 LAB — URIC ACID: Uric Acid: 4.3 mg/dL (ref 3.8–8.4)

## 2019-09-10 ENCOUNTER — Encounter: Payer: Self-pay | Admitting: Family Medicine

## 2019-09-10 ENCOUNTER — Other Ambulatory Visit: Payer: Self-pay

## 2019-09-10 ENCOUNTER — Ambulatory Visit (INDEPENDENT_AMBULATORY_CARE_PROVIDER_SITE_OTHER): Payer: 59 | Admitting: Family Medicine

## 2019-09-10 VITALS — BP 108/74 | Temp 97.3°F | Wt 199.2 lb

## 2019-09-10 DIAGNOSIS — Z0000 Encounter for general adult medical examination without abnormal findings: Secondary | ICD-10-CM | POA: Diagnosis not present

## 2019-09-10 MED ORDER — ALLOPURINOL 300 MG PO TABS: ORAL_TABLET | ORAL | 1 refills | Status: DC

## 2019-09-10 MED ORDER — COLCHICINE 0.6 MG PO TABS: ORAL_TABLET | ORAL | 4 refills | Status: DC

## 2019-09-10 MED ORDER — AMLODIPINE BESYLATE 2.5 MG PO TABS: ORAL_TABLET | ORAL | 1 refills | Status: DC

## 2019-09-10 MED ORDER — ALLOPURINOL 300 MG PO TABS: ORAL_TABLET | ORAL | 5 refills | Status: DC

## 2019-09-10 NOTE — Progress Notes (Signed)
   Subjective:    Patient ID: Robert Brooks, male    DOB: November 26, 1960, 59 y.o.   MRN: [id number]  Hypertension This is a chronic problem. The current episode started more than 1 year ago. Pertinent negatives include no chest pain, headaches or neck pain. Risk factors for coronary artery disease include male gender. Treatments tried: norvasc.    Discuss recent labs and discuss shingles vaccine.  Review of Systems  Constitutional: Negative for activity change, appetite change and fever.  HENT: Negative for congestion and rhinorrhea.   Eyes: Negative for discharge.  Respiratory: Negative for cough and wheezing.   Cardiovascular: Negative for chest pain.  Gastrointestinal: Negative for abdominal pain, blood in stool and vomiting.  Genitourinary: Negative for difficulty urinating and frequency.  Musculoskeletal: Negative for neck pain.  Skin: Negative for rash.  Allergic/Immunologic: Negative for environmental allergies and food allergies.  Neurological: Negative for weakness and headaches.  Psychiatric/Behavioral: Negative for agitation.       Objective:   Physical Exam Constitutional:      Appearance: He is well-developed.  HENT:     Head: Normocephalic and atraumatic.     Right Ear: External ear normal.     Left Ear: External ear normal.     Nose: Nose normal.  Eyes:     Pupils: Pupils are equal, round, and reactive to light.  Neck:     Thyroid: No thyromegaly.  Cardiovascular:     Rate and Rhythm: Normal rate and regular rhythm.     Heart sounds: Normal heart sounds. No murmur heard.   Pulmonary:     Effort: Pulmonary effort is normal. No respiratory distress.     Breath sounds: Normal breath sounds. No wheezing.  Abdominal:     General: Bowel sounds are normal. There is no distension.     Palpations: Abdomen is soft. There is no mass.     Tenderness: There is no abdominal tenderness.  Genitourinary:    Penis: Normal.   Musculoskeletal:        General: Normal  range of motion.     Cervical back: Normal range of motion and neck supple.  Lymphadenopathy:     Cervical: No cervical adenopathy.  Skin:    General: Skin is warm and dry.     Findings: No erythema.  Neurological:     Mental Status: He is alert.     Motor: No abnormal muscle tone.  Psychiatric:        Behavior: Behavior normal.        Judgment: Judgment normal.    Prostate exam normal  Safety dietary measures discussed      Assessment & Plan:  Adult wellness-complete.wellness physical was conducted today. Importance of diet and exercise were discussed in detail.  In addition to this a discussion regarding safety was also covered. We also reviewed over immunizations and gave recommendations regarding current immunization needed for age.  In addition to this additional areas were also touched on including: Preventative health exams needed:  Colonoscopy 2024  Patient was advised yearly wellness exam Patient does take allopurinol he needs to continue this recent labs reviewed LDL slightly up but risk of heart disease is not enough to mandate statin healthy diet regular activity and losing weight recommended Blood pressure good continue amlodipine Follow-up in 6 months

## 2020-03-11 ENCOUNTER — Ambulatory Visit: Payer: 59 | Admitting: Family Medicine

## 2020-05-08 DIAGNOSIS — E782 Mixed hyperlipidemia: Secondary | ICD-10-CM | POA: Insufficient documentation

## 2020-05-09 ENCOUNTER — Telehealth: Payer: Self-pay | Admitting: *Deleted

## 2020-05-09 DIAGNOSIS — R9431 Abnormal electrocardiogram [ECG] [EKG]: Secondary | ICD-10-CM

## 2020-05-09 DIAGNOSIS — E785 Hyperlipidemia, unspecified: Secondary | ICD-10-CM

## 2020-05-09 NOTE — Telephone Encounter (Signed)
-----   Message from Wilma Flavin, RN sent at 05/09/2020 10:43 AM EST ----- Regarding: FW: Outside Order  ----- Message ----- From: Livingston Diones, RT Sent: 05/09/2020  10:33 AM EST To: [facility] Triage Subject: Outside Order                                  Hi  The outside ordering doctor is Flossie Dibble, MD.  Can you please place an order for a calcium score under Dr. Meda Coffee? I have scanned in the original order for outside referral into the patients chart.   Diagnosis is Hyperlipidemia, mixed (E78.2)  and Abdnormal ECG [R94.31].   Thanks! Nunzio Cobbs

## 2020-05-09 NOTE — Telephone Encounter (Signed)
Order placed for this pt to get a Cardiac Calcium Score (self pay) at our office in [place], as [place] dept requested. Test requested by outside ordering doctor is Flossie Dibble, MD, where results should go to.  [facility] dept to call the pt and arrange this appt.  Order placed under reader Dr. Meda Coffee.

## 2020-06-10 ENCOUNTER — Ambulatory Visit
Admission: RE | Admit: 2020-06-10 | Discharge: 2020-06-10 | Disposition: A | Discharge status: Home / Self Care | Payer: BLUE CROSS/BLUE SHIELD | Source: Ambulatory Visit | Attending: Cardiology | Admitting: Cardiology

## 2020-06-10 ENCOUNTER — Other Ambulatory Visit: Payer: Self-pay

## 2020-06-10 DIAGNOSIS — R9431 Abnormal electrocardiogram [ECG] [EKG]: Secondary | ICD-10-CM

## 2020-06-10 DIAGNOSIS — E785 Hyperlipidemia, unspecified: Secondary | ICD-10-CM

## 2020-08-13 ENCOUNTER — Encounter: Payer: Self-pay | Admitting: Family Medicine

## 2020-08-13 DIAGNOSIS — I10 Essential (primary) hypertension: Secondary | ICD-10-CM

## 2020-08-13 DIAGNOSIS — Z79899 Other long term (current) drug therapy: Secondary | ICD-10-CM

## 2020-08-13 DIAGNOSIS — Z125 Encounter for screening for malignant neoplasm of prostate: Secondary | ICD-10-CM

## 2020-08-13 DIAGNOSIS — M1000 Idiopathic gout, unspecified site: Secondary | ICD-10-CM

## 2020-08-14 MED ORDER — ALLOPURINOL 300 MG PO TABS: ORAL_TABLET | ORAL | 0 refills | Status: DC

## 2020-08-14 MED ORDER — AMLODIPINE BESYLATE 2.5 MG PO TABS: ORAL_TABLET | ORAL | 0 refills | Status: DC

## 2020-08-14 NOTE — Telephone Encounter (Signed)
Nurses [place] may have 90 days worth of medicines Please also order lipid, CMP, uric acid, PSA  Also send [place] the following message  Hi Monique We are renewing your refills for 90 days.  Please call our office or send [place] a message to schedule a wellness exam in June or July-also it would be to your advantage to do blood work this summer before that visit.  That blood work has been ordered through [employer] can have this blood drawn at any [place] drawing station  If any ongoing troubles or problems or questions feel free to notify us otherwise I will see you later this summer.  TakeCare-Dr. Nicki Reaper

## 2020-08-14 NOTE — Addendum Note (Signed)
Addended by: Vicente Males on: 08/14/2020 04:36 PM   Modules accepted: Orders

## 2020-08-22 LAB — COMPREHENSIVE METABOLIC PANEL
ALT: 21 IU/L (ref 0–44)
AST: 19 IU/L (ref 0–40)
Albumin/Globulin Ratio: 2.4 — ABNORMAL HIGH (ref 1.2–2.2)
Albumin: 4.4 g/dL (ref 3.8–4.9)
Alkaline Phosphatase: 63 IU/L (ref 44–121)
BUN/Creatinine Ratio: 15 (ref 9–20)
BUN: 17 mg/dL (ref 6–24)
Bilirubin Total: 0.4 mg/dL (ref 0.0–1.2)
CO2: 24 mmol/L (ref 20–29)
Calcium: 9.1 mg/dL (ref 8.7–10.2)
Chloride: 103 mmol/L (ref 96–106)
Creatinine, Ser: 1.14 mg/dL (ref 0.76–1.27)
Globulin, Total: 1.8 g/dL (ref 1.5–4.5)
Glucose: 87 mg/dL (ref 65–99)
Potassium: 4.3 mmol/L (ref 3.5–5.2)
Sodium: 140 mmol/L (ref 134–144)
Total Protein: 6.2 g/dL (ref 6.0–8.5)
eGFR: 75 mL/min/{1.73_m2} (ref >59)

## 2020-08-22 LAB — PSA: Prostate Specific Ag, Serum: 1.6 ng/mL (ref 0.0–4.0)

## 2020-08-22 LAB — URIC ACID: Uric Acid: 4.2 mg/dL (ref 3.8–8.4)

## 2020-09-19 ENCOUNTER — Encounter: Payer: 59 | Admitting: Family Medicine

## 2020-10-10 ENCOUNTER — Other Ambulatory Visit: Payer: Self-pay

## 2020-10-10 ENCOUNTER — Ambulatory Visit (INDEPENDENT_AMBULATORY_CARE_PROVIDER_SITE_OTHER): Payer: 59 | Admitting: Family Medicine

## 2020-10-10 VITALS — BP 108/78 | Temp 97.7°F | Ht 67.5 in | Wt 203.4 lb

## 2020-10-10 DIAGNOSIS — M1000 Idiopathic gout, unspecified site: Secondary | ICD-10-CM

## 2020-10-10 DIAGNOSIS — Z0000 Encounter for general adult medical examination without abnormal findings: Secondary | ICD-10-CM

## 2020-10-10 DIAGNOSIS — Z0001 Encounter for general adult medical examination with abnormal findings: Secondary | ICD-10-CM | POA: Diagnosis not present

## 2020-10-10 DIAGNOSIS — I10 Essential (primary) hypertension: Secondary | ICD-10-CM | POA: Diagnosis not present

## 2020-10-10 MED ORDER — ALLOPURINOL 300 MG PO TABS: ORAL_TABLET | ORAL | 3 refills | Status: DC

## 2020-10-10 MED ORDER — AMLODIPINE BESYLATE 2.5 MG PO TABS: ORAL_TABLET | ORAL | 3 refills | Status: DC

## 2020-10-10 NOTE — Progress Notes (Signed)
   Subjective:    Patient ID: Robert Brooks, male    DOB: 12-19-1960, 60 y.o.   MRN: [id number]  HPI The patient comes in today for a wellness visit.  Patient has not had any gout flareups taking his allopurinol daily  A review of their health history was completed.  A review of medications was also completed.  Any needed refills; Allopurinol, Amlodipine, Colchicine, Indocin   Eating habits: healthy eating   Falls/  MVA accidents in past few months: none  Regular exercise: not enough  Specialist pt sees on regular basis: none  Preventative health issues were discussed.   Additional concerns:     Review of Systems     Objective:   Physical Exam  General-in no acute distress Eyes-no discharge Lungs-respiratory rate normal, CTA CV-no murmurs,RRR Extremities skin warm dry no edema Neuro grossly normal Behavior normal, alert  Prostate mildly enlarged no hard nodules     Assessment & Plan:  1. Well adult exam Adult wellness-complete.wellness physical was conducted today. Importance of diet and exercise were discussed in detail.  In addition to this a discussion regarding safety was also covered. We also reviewed over immunizations and gave recommendations regarding current immunization needed for age.  In addition to this additional areas were also touched on including: Preventative health exams needed:  Colonoscopy 2024  Patient was advised yearly wellness exam  Labs were discussed with the patient Recent coronary artery score negative Cholesterol mildly elevated Patient will do lab work again next June with follow-up wellness  History of high blood pressure takes his medicine regular basis under good control  Gout no flareups recently taken his allopurinol

## 2021-01-04 ENCOUNTER — Encounter: Payer: Self-pay | Admitting: Emergency Medicine

## 2021-01-04 ENCOUNTER — Other Ambulatory Visit: Payer: Self-pay

## 2021-01-04 ENCOUNTER — Ambulatory Visit
Admission: EM | Admit: 2021-01-04 | Discharge: 2021-01-04 | Disposition: A | Discharge status: Home / Self Care | Payer: 59 | Attending: Family Medicine | Admitting: Family Medicine

## 2021-01-04 ENCOUNTER — Ambulatory Visit: Payer: Self-pay

## 2021-01-04 DIAGNOSIS — R509 Fever, unspecified: Secondary | ICD-10-CM | POA: Diagnosis not present

## 2021-01-04 DIAGNOSIS — J069 Acute upper respiratory infection, unspecified: Secondary | ICD-10-CM

## 2021-01-04 MED ORDER — OSELTAMIVIR PHOSPHATE 75 MG PO CAPS: 75.0000 mg | ORAL_CAPSULE | Freq: Two times a day (BID) | ORAL | 0 refills | Status: DC

## 2021-01-04 MED ORDER — PROMETHAZINE-DM 6.25-15 MG/5ML PO SYRP: 5.0000 mL | ORAL_SOLUTION | Freq: Four times a day (QID) | ORAL | 0 refills | Status: DC | PRN

## 2021-01-04 NOTE — ED Triage Notes (Signed)
Since yesterday has had sinus draining, headache and fever. Home covid test negative.

## 2021-01-04 NOTE — ED Provider Notes (Signed)
[facility]    CSN: [id number] Arrival date & time: 01/04/21  1339      History   Chief Complaint Chief Complaint  Patient presents with   URI    HPI Robert Brooks is a 60 y.o. male.   Patient presenting today with 1 day history of sudden onset headache, fever, body aches, chills, fatigue, sinus drainage, nasal congestion, sore throat, cough.  Denies abdominal pain, nausea vomiting diarrhea, chest pain, shortness of breath.  Taking Sudafed, Tylenol and cough syrups with mild temporary relief.  No known sick contacts recently.  No known pertinent chronic medical problems.   Past Medical History:  Diagnosis Date   Bursitis    left shoulder   Gout    Hypertension    Tingling     Patient Active Problem List   Diagnosis Date Noted   Fever, unspecified 01/04/2021   Arm paresthesia, right 04/16/2019   Essential hypertension 11/16/2017   Primary gout 08/04/2015   Family hx of colon cancer 08/04/2015   Tinnitus 08/04/2015   Elevated blood pressure 09/19/2012   Nonspecific abnormal electrocardiogram (ECG) (EKG) 09/19/2012    Past Surgical History:  Procedure Laterality Date   TONSILLECTOMY         Home Medications    Prior to Admission medications   Medication Sig Start Date End Date Taking? Authorizing Provider  oseltamivir (TAMIFLU) 75 MG capsule Take 1 capsule (75 mg total) by mouth every 12 (twelve) hours. 01/04/21  Yes Volney American, PA-C  promethazine-dextromethorphan (PROMETHAZINE-DM) 6.25-15 MG/5ML syrup Take 5 mLs by mouth 4 (four) times daily as needed for cough. 01/04/21  Yes Volney American, PA-C  acetaminophen (TYLENOL) 325 MG tablet Take 650 mg by mouth every 6 (six) hours as needed.    [provider]  allopurinol (ZYLOPRIM) 300 MG tablet TAKE ONE TABLET BY MOUTH DAILY 10/10/20   Sallee Lange A, MD  amLODipine (NORVASC) 2.5 MG tablet TAKE 1 TABLET(2.5 MG) BY MOUTH DAILY 10/10/20   Kathyrn Drown, MD  colchicine  0.6 MG tablet TAKE 1 TABLET(0.6 MG) BY MOUTH TWICE DAILY PRN Gout 09/10/19   Kathyrn Drown, MD  indomethacin (INDOCIN) 50 MG capsule Take 1 capsule (50 mg total) by mouth 3 (three) times daily as needed. 06/14/17   Kathyrn Drown, MD    Family History Family History  Problem Relation Age of Onset   Colon cancer Mother 44   Diabetes Mother    Heart disease Father    Diabetes Father    Congestive Heart Failure Father    Esophageal cancer Neg Hx    Rectal cancer Neg Hx    Stomach cancer Neg Hx     Social History Social History   Tobacco Use   Smoking status: Never   Smokeless tobacco: Current    Types: Snuff  Vaping Use   Vaping Use: Never used  Substance Use Topics   Alcohol use: Yes    Comment: socially   Drug use: No     Allergies   Patient has no known allergies.   Review of Systems Review of Systems Per HPI  Physical Exam Triage Vital Signs ED Triage Vitals [01/04/21 1401]  Enc Vitals Group     BP 123/83     Pulse Rate (!) 104     Resp 18     Temp 98.8 F (37.1 C)     Temp Source Oral     SpO2 95 %     Weight  200 lb (90.7 kg)     Height      Head Circumference      Peak Flow      Pain Score 0     Pain Loc      Pain Edu?      Excl. in [place]?    No data found.  Updated Vital Signs BP 123/83 (BP Location: Right Arm)   Pulse (!) 104   Temp 98.8 F (37.1 C) (Oral)   Resp 18   Wt 200 lb (90.7 kg)   SpO2 95%   BMI 30.86 kg/m   Visual Acuity Right Eye Distance:   Left Eye Distance:   Bilateral Distance:    Right Eye Near:   Left Eye Near:    Bilateral Near:     Physical Exam Vitals and nursing note reviewed.  Constitutional:      Appearance: Normal appearance.  HENT:     Head: Atraumatic.     Right Ear: Tympanic membrane normal.     Left Ear: Tympanic membrane normal.     Nose: Rhinorrhea present.     Mouth/Throat:     Mouth: Mucous membranes are moist.     Pharynx: Oropharynx is clear. Posterior oropharyngeal erythema present.   Eyes:     Extraocular Movements: Extraocular movements intact.     Conjunctiva/sclera: Conjunctivae normal.  Cardiovascular:     Rate and Rhythm: Normal rate and regular rhythm.  Pulmonary:     Effort: Pulmonary effort is normal.     Breath sounds: Normal breath sounds. No wheezing or rales.  Musculoskeletal:        General: Normal range of motion.     Cervical back: Normal range of motion and neck supple.  Skin:    General: Skin is warm and dry.  Neurological:     General: No focal deficit present.     Mental Status: He is oriented to person, place, and time.  Psychiatric:        Mood and Affect: Mood normal.        Thought Content: Thought content normal.        Judgment: Judgment normal.     UC Treatments / Results  Labs (all labs ordered are listed, but only abnormal results are displayed) Labs Reviewed  COVID-19, FLU A+B AND RSV    EKG   Radiology No results found.  Procedures Procedures (including critical care time)  Medications Ordered in UC Medications - No data to display  Initial Impression / Assessment and Plan / UC Course  I have reviewed the triage vital signs and the nursing notes.  Pertinent labs & imaging results that were available during my care of the patient were reviewed by me and considered in my medical decision making (see chart for details).     Vital signs and exam reassuring today, suspect flulike illness causing symptoms.  COVID, flu, RSV testing pending, will start Tamiflu, Phenergan DM and continued over-the-counter medications and supportive home care.  Return for acutely worsening symptoms.  Quarantine protocol reviewed.  Final Clinical Impressions(s) / UC Diagnoses   Final diagnoses:  Fever, unspecified  Viral URI with cough   Discharge Instructions   None    ED Prescriptions     Medication Sig Dispense Auth. Provider   oseltamivir (TAMIFLU) 75 MG capsule Take 1 capsule (75 mg total) by mouth every 12 (twelve) hours.  10 capsule Volney American, [place]   promethazine-dextromethorphan (PROMETHAZINE-DM) 6.25-15 MG/5ML syrup Take 5 mLs by mouth  4 (four) times daily as needed for cough. 100 mL Volney American, [place]      PDMP not reviewed this encounter.   Volney American, [place] 01/04/21 1426

## 2021-01-05 ENCOUNTER — Encounter: Payer: Self-pay | Admitting: Family Medicine

## 2021-01-05 LAB — COVID-19, FLU A+B AND RSV
Influenza A, NAA: NOT DETECTED
Influenza B, NAA: NOT DETECTED
RSV, NAA: NOT DETECTED
SARS-CoV-2, NAA: DETECTED — AB

## 2021-01-06 ENCOUNTER — Other Ambulatory Visit: Payer: Self-pay

## 2021-01-06 ENCOUNTER — Ambulatory Visit (INDEPENDENT_AMBULATORY_CARE_PROVIDER_SITE_OTHER): Payer: 59 | Admitting: Family Medicine

## 2021-01-06 DIAGNOSIS — U071 COVID-19: Secondary | ICD-10-CM | POA: Diagnosis not present

## 2021-01-06 MED ORDER — NIRMATRELVIR/RITONAVIR (PAXLOVID)TABLET: 3.0000 | ORAL_TABLET | Freq: Two times a day (BID) | ORAL | 0 refills | Status: AC

## 2021-01-06 NOTE — Progress Notes (Signed)
   Subjective:    Patient ID: Robert Brooks, male    DOB: Jun 09, 1960, 60 y.o.   MRN: [id number]  HPI  Patient presents today with respiratory illness Number of days present-Saturday (3 days)  Symptoms include- sinus drainage, cough; symptoms started out with headache and fever. Fever has subsided.   Presence of worrisome signs (severe shortness of breath, lethargy, etc.) - none  Recent/current visit to urgent care or ER- Urgent Care 01/04/21  Recent direct exposure to Covid- yes  Any current Covid testing- yes   Virtual Visit via Telephone Note  I connected with Robert Brooks on 01/06/21 at  4:30 PM EDT by telephone and verified that I am speaking with the correct person using two identifiers.  Location: Patient: home Provider: office   I discussed the limitations, risks, security and privacy concerns of performing an evaluation and management service by telephone and the availability of in person appointments. I also discussed with the patient that there may be a patient responsible charge related to this service. The patient expressed understanding and agreed to proceed.   History of Present Illness: Patient started with symptoms late Friday night early Saturday morning with severe headache then had fever body aches runny nose cough congestion states starting to improve now feels better but still feels fatigued.  We did discuss how his weight as well as blood pressure puts him at a higher risk of complications because of this we elected to go ahead and start medicine patient agrees shared decision   Observations/Objective:   Assessment and Plan:   Follow Up Instructions:    I discussed the assessment and treatment plan with the patient. The patient was provided an opportunity to ask questions and all were answered. The patient agreed with the plan and demonstrated an understanding of the instructions.   The patient was advised to call back or seek an in-person  evaluation if the symptoms worsen or if the condition fails to improve as anticipated.  I provided  15 including discussion and documentation minutes of non-face-to-face time during this encounter.     Review of Systems     Objective:   Physical Exam  Today's visit was via telephone Physical exam was not possible for this visit       Assessment & Plan:  COVID infection Warning signs discussed If shortness of breath difficulty breathing or progressive worsening occurs go to ER, notify us if getting worse, medication discussed in detail including side effects and direction.  Hold off on colchicine.  Hold amlodipine for the next 5 days Paxlovid is approved for 5 days.  Kidney function good.  Stay home from work May return to work on Thursday with a mask for 5 days

## 2021-01-06 NOTE — Telephone Encounter (Signed)
Nurses I would need to do a phone visit with the patient regarding his COVID and has request for Paxlovid  Please set up for 4:30 PM today

## 2021-01-07 ENCOUNTER — Encounter: Payer: Self-pay | Admitting: Family Medicine

## 2021-04-20 ENCOUNTER — Telehealth: Payer: Self-pay | Admitting: Family Medicine

## 2021-04-20 DIAGNOSIS — Z0000 Encounter for general adult medical examination without abnormal findings: Secondary | ICD-10-CM

## 2021-04-20 DIAGNOSIS — Z8739 Personal history of other diseases of the musculoskeletal system and connective tissue: Secondary | ICD-10-CM

## 2021-04-20 DIAGNOSIS — E7849 Other hyperlipidemia: Secondary | ICD-10-CM

## 2021-04-20 DIAGNOSIS — Z125 Encounter for screening for malignant neoplasm of prostate: Secondary | ICD-10-CM

## 2021-04-20 NOTE — Telephone Encounter (Signed)
Lipid, liver, metabolic 7, uric acid, PSA, CBC Wellness, screening prostate cancer, hyperlipidemia, history of gout

## 2021-04-20 NOTE — Telephone Encounter (Signed)
Pt has a physical 10/14/21 with Dr Nicki Reaper. Please send lab work.

## 2021-04-21 NOTE — Telephone Encounter (Signed)
Left message on voicemail with pt identifying himself that labs were ordered, needs to be fasting and any questions to call office.

## 2021-04-21 NOTE — Telephone Encounter (Signed)
Labs ordered in epic

## 2021-06-08 ENCOUNTER — Other Ambulatory Visit: Payer: Self-pay

## 2021-06-08 MED ORDER — AMLODIPINE BESYLATE 2.5 MG PO TABS: ORAL_TABLET | ORAL | 1 refills | Status: DC

## 2021-06-08 MED ORDER — ALLOPURINOL 300 MG PO TABS: ORAL_TABLET | ORAL | 1 refills | Status: DC

## 2021-06-15 ENCOUNTER — Encounter: Payer: Self-pay | Admitting: Family Medicine

## 2021-06-16 MED ORDER — AMLODIPINE BESYLATE 2.5 MG PO TABS: ORAL_TABLET | ORAL | 1 refills | Status: DC

## 2021-06-16 MED ORDER — ALLOPURINOL 300 MG PO TABS: ORAL_TABLET | ORAL | 1 refills | Status: DC

## 2021-07-13 ENCOUNTER — Ambulatory Visit: Payer: 59 | Admitting: Dermatology

## 2021-07-13 DIAGNOSIS — L570 Actinic keratosis: Secondary | ICD-10-CM | POA: Diagnosis not present

## 2021-07-13 DIAGNOSIS — L820 Inflamed seborrheic keratosis: Secondary | ICD-10-CM | POA: Diagnosis not present

## 2021-07-13 DIAGNOSIS — L821 Other seborrheic keratosis: Secondary | ICD-10-CM

## 2021-07-13 DIAGNOSIS — L814 Other melanin hyperpigmentation: Secondary | ICD-10-CM

## 2021-07-13 DIAGNOSIS — Z1283 Encounter for screening for malignant neoplasm of skin: Secondary | ICD-10-CM

## 2021-07-13 DIAGNOSIS — L738 Other specified follicular disorders: Secondary | ICD-10-CM

## 2021-07-13 DIAGNOSIS — D692 Other nonthrombocytopenic purpura: Secondary | ICD-10-CM

## 2021-07-13 DIAGNOSIS — D489 Neoplasm of uncertain behavior, unspecified: Secondary | ICD-10-CM

## 2021-07-13 DIAGNOSIS — D229 Melanocytic nevi, unspecified: Secondary | ICD-10-CM

## 2021-07-13 DIAGNOSIS — L578 Other skin changes due to chronic exposure to nonionizing radiation: Secondary | ICD-10-CM | POA: Diagnosis not present

## 2021-07-13 DIAGNOSIS — D1800 Hemangioma unspecified site: Secondary | ICD-10-CM

## 2021-07-13 NOTE — Progress Notes (Signed)
? ?Follow-Up Visit ?  ?Subjective  ?Robert Brooks is a 61 y.o. male who presents for the following: Annual Exam (Tbse skin tag under left arm , patient reports history of skin cancer in father. Denies personal history of skin cancer. ). ?The patient presents for Total-Body Skin Exam (TBSE) for skin cancer screening and mole check.  The patient has spots, moles and lesions to be evaluated, some may be new or changing and the patient has concerns that these could be cancer. ? ?The following portions of the chart were reviewed this encounter and updated as appropriate:  Tobacco  Allergies  Meds  Problems  Med Hx  Surg Hx  Fam Hx   ?  ?Review of Systems: No other skin or systemic complaints except as noted in HPI or Assessment and Plan. ? ?Objective  ?Well appearing patient in no apparent distress; mood and affect are within normal limits. ? ?A full examination was performed including scalp, head, eyes, ears, nose, lips, neck, chest, axillae, abdomen, back, buttocks, bilateral upper extremities, bilateral lower extremities, hands, feet, fingers, toes, fingernails, and toenails. All findings within normal limits unless otherwise noted below. ? ?face x 3 (3) ?Erythematous thin papules/macules with gritty scale.  ? ?Left Axilla ?1.5 cm verrucous papule  ? ? ? ? ? ? ? ?Assessment & Plan  ?Actinic keratosis (3) ?face x 3 ?Actinic keratoses are precancerous spots that appear secondary to cumulative UV radiation exposure/sun exposure over time. They are chronic with expected duration over 1 year. A portion of actinic keratoses will progress to squamous cell carcinoma of the skin. It is not possible to reliably predict which spots will progress to skin cancer and so treatment is recommended to prevent development of skin cancer. ? ?Recommend daily broad spectrum sunscreen SPF 30+ to sun-exposed areas, reapply every 2 hours as needed.  ?Recommend staying in the shade or wearing long sleeves, sun glasses (UVA+UVB  protection) and wide brim hats (4-inch brim around the entire circumference of the hat). ?Call for new or changing lesions. ? ?Destruction of lesion - face x 3 ?Complexity: simple   ?Destruction method: cryotherapy   ?Informed consent: discussed and consent obtained   ?Timeout:  patient name, date of birth, surgical site, and procedure verified ?Lesion destroyed using liquid nitrogen: Yes   ?Region frozen until ice ball extended beyond lesion: Yes   ?Outcome: patient tolerated procedure well with no complications   ?Post-procedure details: wound care instructions given   ?Additional details:  Prior to procedure, discussed risks of blister formation, small wound, skin dyspigmentation, or rare scar following cryotherapy. Recommend Vaseline ointment to treated areas while healing. ? ?Neoplasm of uncertain behavior ?Left Axilla ?Epidermal / dermal shaving ? ?Lesion diameter (cm):  1.5 ?Informed consent: discussed and consent obtained   ?Timeout: patient name, date of birth, surgical site, and procedure verified   ?Procedure prep:  Patient was prepped and draped in usual sterile fashion ?Prep type:  Isopropyl alcohol ?Anesthesia: the lesion was anesthetized in a standard fashion   ?Anesthetic:  1% lidocaine w/ epinephrine 1-100,000 buffered w/ 8.4% NaHCO3 ?Instrument used: flexible razor blade   ?Hemostasis achieved with: pressure, aluminum chloride and electrodesiccation   ?Outcome: patient tolerated procedure well   ?Post-procedure details: sterile dressing applied and wound care instructions given   ?Dressing type: bandage and petrolatum   ? ?Specimen 1 - Surgical pathology ?Differential Diagnosis: Isk vs skin tag r/o ca  ?Check Margins: No ? ?Lentigines ?- Scattered tan macules ?- Due to sun  exposure ?- Benign-appearing, observe ?- Recommend daily broad spectrum sunscreen SPF 30+ to sun-exposed areas, reapply every 2 hours as needed. ?- Call for any changes ? ?Sebaceous Hyperplasia ?- Small yellow papules with a  central dell ?- Benign ?- Observe ?Discussed the treatment option of BBL/laser.  Typically we recommend 1-3 treatment sessions about 5-8 weeks apart for best results.  The patient's condition may require "maintenance treatments" in the future.  The fee for BBL / laser treatments is $350 per treatment session for the whole face.  A fee can be quoted for other parts of the body. ?Insurance typically does not pay for BBL/laser treatments and therefore the fee is an out-of-pocket cost. ? ?Seborrheic Keratoses ?- Stuck-on, waxy, tan-brown papules and/or plaques  ?- Benign-appearing ?- Discussed benign etiology and prognosis. ?- Observe ?- Call for any changes ? ?Purpura - Chronic; persistent and recurrent.  Treatable, but not curable. ?- Violaceous macules and patches ?- Benign ?- Related to trauma, age, sun damage and/or use of blood thinners, chronic use of topical and/or oral steroids ?- Observe ?- Can use OTC arnica containing moisturizer such as Dermend Bruise Formula if desired ?- Call for worsening or other concerns ? ?Melanocytic Nevi ?- Tan-brown and/or pink-flesh-colored symmetric macules and papules ?- Benign appearing on exam today ?- Observation ?- Call clinic for new or changing moles ?- Recommend daily use of broad spectrum spf 30+ sunscreen to sun-exposed areas.  ? ?Hemangiomas ?- Red papules ?- Discussed benign nature ?- Observe ?- Call for any changes ? ?Actinic Damage ?- Chronic condition, secondary to cumulative UV/sun exposure ?- diffuse scaly erythematous macules with underlying dyspigmentation ?- Recommend daily broad spectrum sunscreen SPF 30+ to sun-exposed areas, reapply every 2 hours as needed.  ?- Staying in the shade or wearing long sleeves, sun glasses (UVA+UVB protection) and wide brim hats (4-inch brim around the entire circumference of the hat) are also recommended for sun protection.  ?- Call for new or changing lesions. ? ?Skin cancer screening performed today. ?Return in about 1 year  (around 07/14/2022) for TBSE. ?I, Ruthell Rummage, CMA, am acting as scribe for Sarina Ser, MD. ?Documentation: I have reviewed the above documentation for accuracy and completeness, and I agree with the above. ? ?Sarina Ser, MD ? ?

## 2021-07-13 NOTE — Patient Instructions (Addendum)
?Biopsy Wound Care Instructions ? ?Leave the original bandage on for 24 hours if possible.  If the bandage becomes soaked or soiled before that time, it is OK to remove it and examine the wound.  A small amount of post-operative bleeding is normal.  If excessive bleeding occurs, remove the bandage, place gauze over the site and apply continuous pressure (no peeking) over the area for 30 minutes. If this does not work, please call our clinic as soon as possible or page your doctor if it is after hours.  ? ?Once a day, cleanse the wound with soap and water. It is fine to shower. If a thick crust develops you may use a Q-tip dipped into dilute hydrogen peroxide (mix 1:1 with water) to dissolve it.  Hydrogen peroxide can slow the healing process, so use it only as needed.   ? ?After washing, apply petroleum jelly (Vaseline) or an antibiotic ointment if your doctor prescribed one for you, followed by a bandage.   ? ?For best healing, the wound should be covered with a layer of ointment at all times. If you are not able to keep the area covered with a bandage to hold the ointment in place, this may mean re-applying the ointment several times a day.  Continue this wound care until the wound has healed and is no longer open.  ? ?Itching and mild discomfort is normal during the healing process. However, if you develop pain or severe itching, please call our office.  ? ?If you have any discomfort, you can take Tylenol (acetaminophen) or ibuprofen as directed on the bottle. (Please do not take these if you have an allergy to them or cannot take them for another reason). ? ?Some redness, tenderness and white or yellow material in the wound is normal healing.  If the area becomes very sore and red, or develops a thick yellow-green material (pus), it may be infected; please notify us.   ? ?If you have stitches, return to clinic as directed to have the stitches removed. You will continue wound care for 2-3 days after the stitches  are removed.  ? ?Wound healing continues for up to one year following surgery. It is not unusual to experience pain in the scar from time to time during the interval.  If the pain becomes severe or the scar thickens, you should notify the office.   ? ?A slight amount of redness in a scar is expected for the first six months.  After six months, the redness will fade and the scar will soften and fade.  The color difference becomes less noticeable with time.  If there are any problems, return for a post-op surgery check at your earliest convenience. ? ?To improve the appearance of the scar, you can use silicone scar gel, cream, or sheets (such as Mederma or Serica) every night for up to one year. These are available over the counter (without a prescription). ? ?Please call our office at [phone] for any questions or concerns. ? ? ?Actinic keratoses are precancerous spots that appear secondary to cumulative UV radiation exposure/sun exposure over time. They are chronic with expected duration over 1 year. A portion of actinic keratoses will progress to squamous cell carcinoma of the skin. It is not possible to reliably predict which spots will progress to skin cancer and so treatment is recommended to prevent development of skin cancer. ? ?Recommend daily broad spectrum sunscreen SPF 30+ to sun-exposed areas, reapply every 2 hours as needed.  ?Recommend  staying in the shade or wearing long sleeves, sun glasses (UVA+UVB protection) and wide brim hats (4-inch brim around the entire circumference of the hat). ?Call for new or changing lesions.  ? ?Cryotherapy Aftercare ? ?Wash gently with soap and water everyday.   ?Apply Vaseline and Band-Aid daily until healed.  ? ? ? ? ? ?Melanoma ABCDEs ? ?Melanoma is the most dangerous type of skin cancer, and is the leading cause of death from skin disease.  You are more likely to develop melanoma if you: ?Have light-colored skin, light-colored eyes, or red or blond hair ?Spend a  lot of time in the sun ?Tan regularly, either outdoors or in a tanning bed ?Have had blistering sunburns, especially during childhood ?Have a close family member who has had a melanoma ?Have atypical moles or large birthmarks ? ?Early detection of melanoma is key since treatment is typically straightforward and cure rates are extremely high if we catch it early.  ? ?The first sign of melanoma is often a change in a mole or a new dark spot.  The ABCDE system is a way of remembering the signs of melanoma. ? ?A for asymmetry:  The two halves do not match. ?B for border:  The edges of the growth are irregular. ?C for color:  A mixture of colors are present instead of an even brown color. ?D for diameter:  Melanomas are usually (but not always) greater than 52m - the size of a pencil eraser. ?E for evolution:  The spot keeps changing in size, shape, and color. ? ?Please check your skin once per month between visits. You can use a small mirror in front and a large mirror behind you to keep an eye on the back side or your body.  ? ?If you see any new or changing lesions before your next follow-up, please call to schedule a visit. ? ?Please continue daily skin protection including broad spectrum sunscreen SPF 30+ to sun-exposed areas, reapplying every 2 hours as needed when you're outdoors.  ? ?Staying in the shade or wearing long sleeves, sun glasses (UVA+UVB protection) and wide brim hats (4-inch brim around the entire circumference of the hat) are also recommended for sun protection.   ? ?If You Need Anything After Your Visit ? ?If you have any questions or concerns for your doctor, please call our main line at [phone] press option 4 to reach your doctor's medical assistant. If no one answers, please leave a voicemail as directed and we will return your call as soon as possible. Messages left after 4 pm will be answered the following business day.  ? ?You may also send [place] message via MyChart. We typically  respond to MyChart messages within 1-2 business days. ? ?For prescription refills, please ask your pharmacy to contact our office. Our fax number is [phone] ? ?If you have an urgent issue when the clinic is closed that cannot wait until the next business day, you can page your doctor at the number below.   ? ?Please note that while we do our best to be available for urgent issues outside of office hours, we are not available 24/7.  ? ?If you have an urgent issue and are unable to reach [place] you may choose to seek medical care at your doctor's office, retail clinic, urgent care center, or emergency room. ? ?If you have a medical emergency, please immediately call 911 or go to the emergency department. ? ?Pager Numbers ? ?- Dr. KNehemiah Massed  [phone] ? ?- Dr. Laurence Ferrari: [phone] ? ?- Dr. Nicole Kindred: [phone] ? ?In the event of inclement weather, please call our main line at [phone] for an update on the status of any delays or closures. ? ?Dermatology Medication Tips: ?Please keep the boxes that topical medications come in in order to help keep track of the instructions about where and how to use these. Pharmacies typically print the medication instructions only on the boxes and not directly on the medication tubes.  ? ?If your medication is too expensive, please contact our office at [phone] option 4 or send [place] a message through [facility].  ? ?We are unable to tell what your co-pay for medications will be in advance as this is different depending on your insurance coverage. However, we may be able to find a substitute medication at lower cost or fill out paperwork to get insurance to cover a needed medication.  ? ?If a prior authorization is required to get your medication covered by your insurance company, please allow [place] 1-2 business days to complete this process. ? ?Drug prices often vary depending on where the prescription is filled and some pharmacies may offer cheaper prices. ? ?The website  www.goodrx.com contains coupons for medications through different pharmacies. The prices here do not account for what the cost may be with help from insurance (it may be cheaper with your insurance), but the we

## 2021-07-15 ENCOUNTER — Telehealth: Payer: Self-pay

## 2021-07-15 NOTE — Telephone Encounter (Signed)
Advised pt of bx results/sh ?

## 2021-07-15 NOTE — Telephone Encounter (Signed)
-----   Message from Ralene Bathe, MD sent at 07/14/2021  8:52 PM EDT ----- ?Diagnosis ?Skin , left axilla ?SEBORRHEIC KERATOSIS, IRRITATED ? ?Benign irritated keratosis ?No further treatment needed. ?

## 2021-07-21 ENCOUNTER — Encounter: Payer: Self-pay | Admitting: Dermatology

## 2021-08-31 IMAGING — CT CT CARDIAC CORONARY ARTERY CALCIUM SCORE
3 series · 14 of 20 positions shown, 15 images · non-contrast
Comparison: None.
COMPARISON: None.

Addendum:
EXAM:
OVER-READ INTERPRETATION  CT CHEST

The following report is an over-read performed by radiologist Dr.
Dely Castle [REDACTED] on 06/10/2020. This
over-read does not include interpretation of cardiac or coronary
anatomy or pathology. The coronary calcium score interpretation by
the cardiologist is attached.
CLINICAL DATA: Cardiovascular Disease Risk stratification
Coronary Calcium Score
TECHNIQUE: A gated, non-contrast computed tomography scan of the heart was
performed using 3mm slice thickness. Axial images were analyzed on a
dedicated workstation. Calcium scoring of the coronary arteries was
performed using the Agatston method.

[Series 2: casc 3.0 bv41 2 bestdiast 72 % · axial · 0.40mm/px · z∈[-224,-130]mm · 4 of 53 slices shown, 5 images]
[im 11/53  vessel]
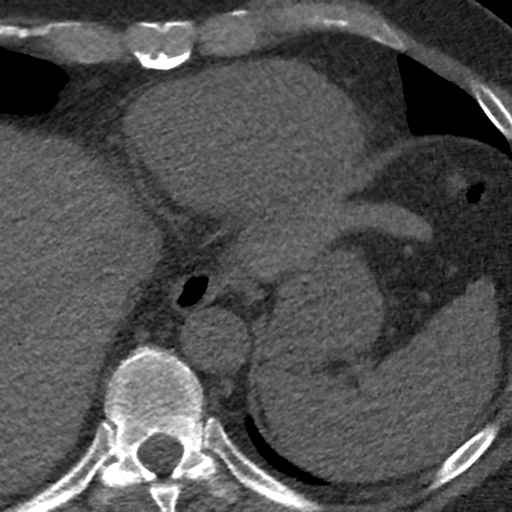
[im 11/53  lung]
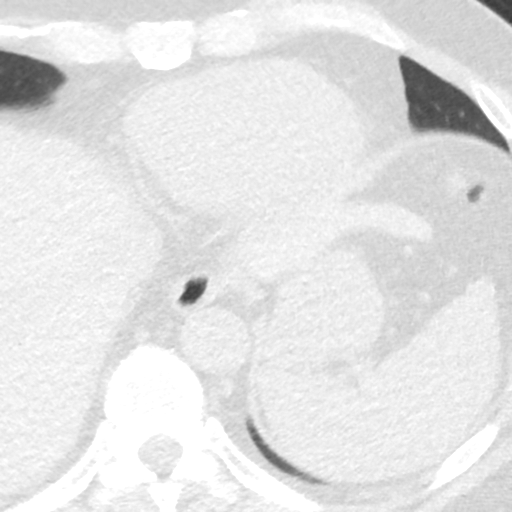
[im 21/53  vessel]
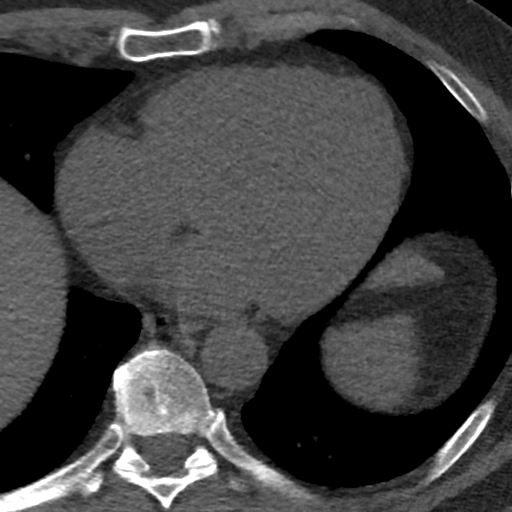
[im 32/53  vessel]
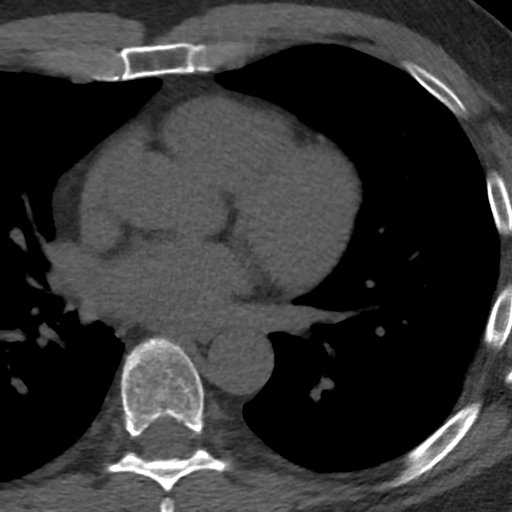
[im 42/53  vessel]
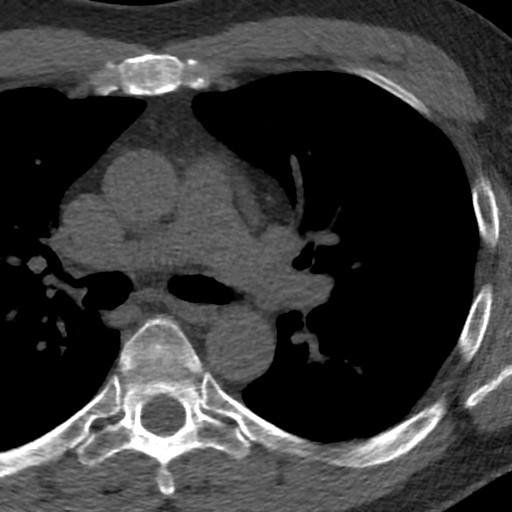

[Series 3: lung 71 % · axial · 0.72mm/px · z∈[-230,-124]mm · 5 of 53 slices shown]
[im 9/53  lung]
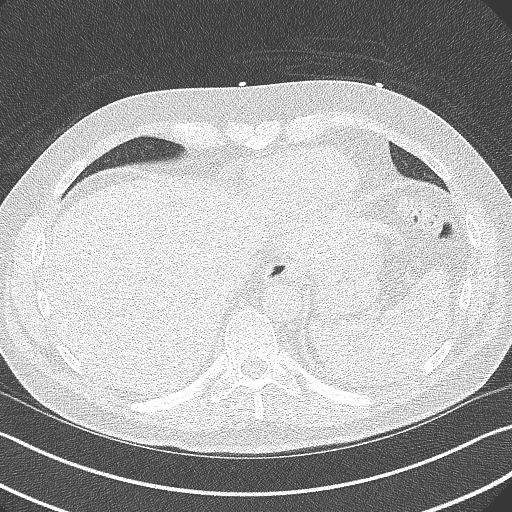
[im 18/53  lung]
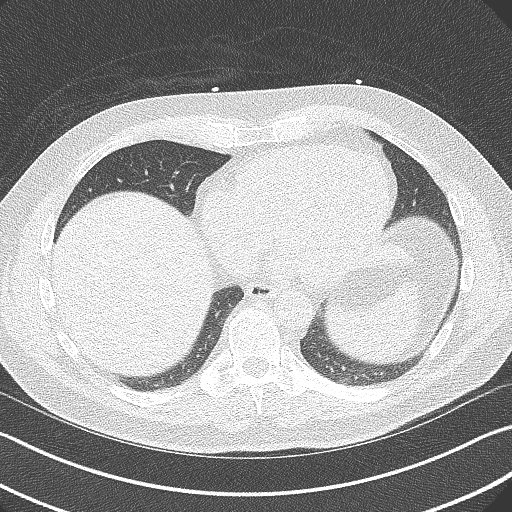
[im 27/53  lung]
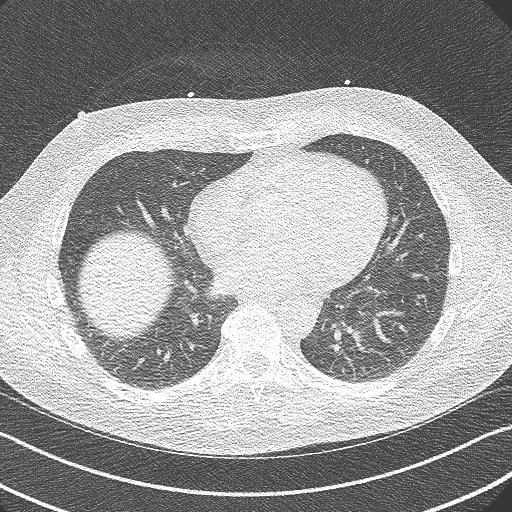
[im 35/53  lung]
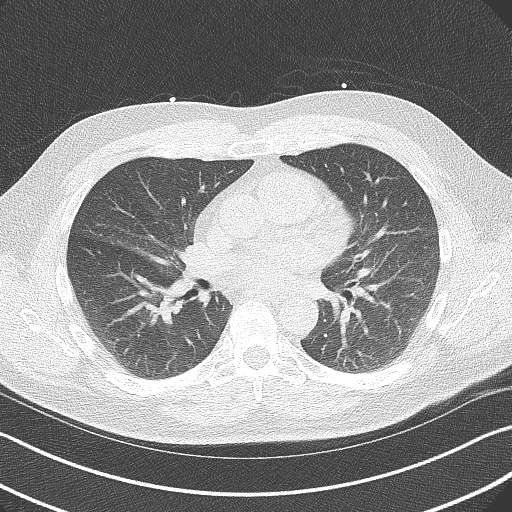
[im 44/53  lung]
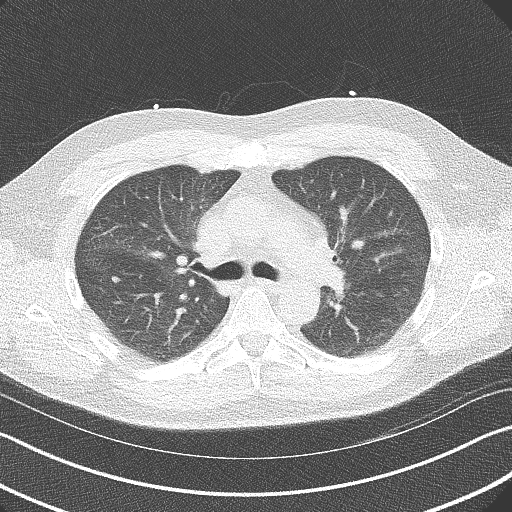

[Series 4: lung st 71 % · axial · 0.72mm/px · z∈[-230,-124]mm · 5 of 53 slices shown]
[im 9/53  lung]
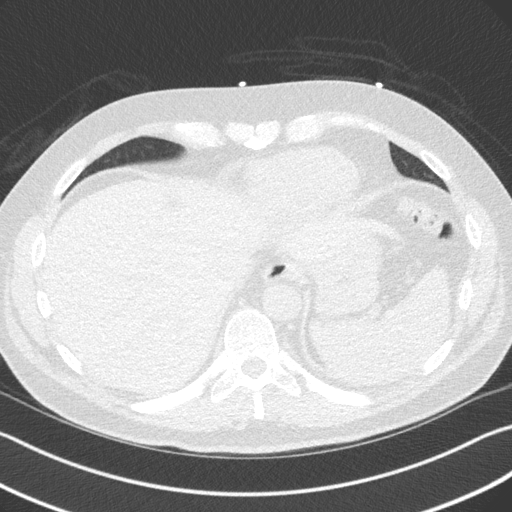
[im 18/53  lung]
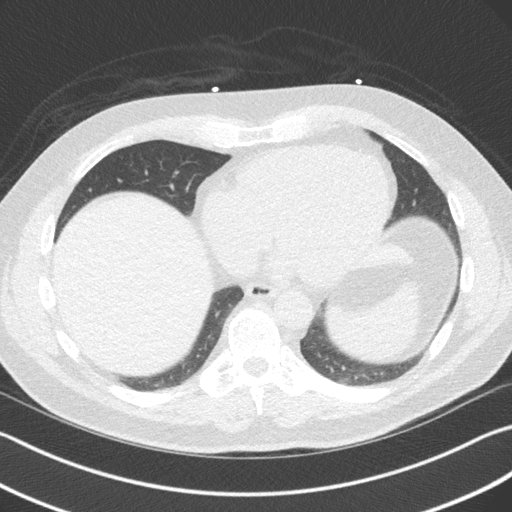
[im 27/53  lung]
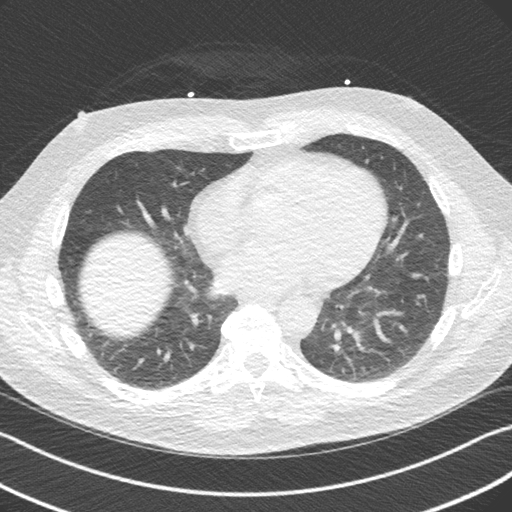
[im 35/53  lung]
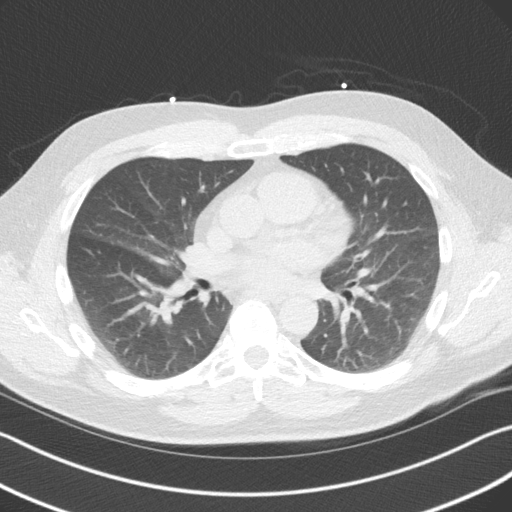
[im 44/53  lung]
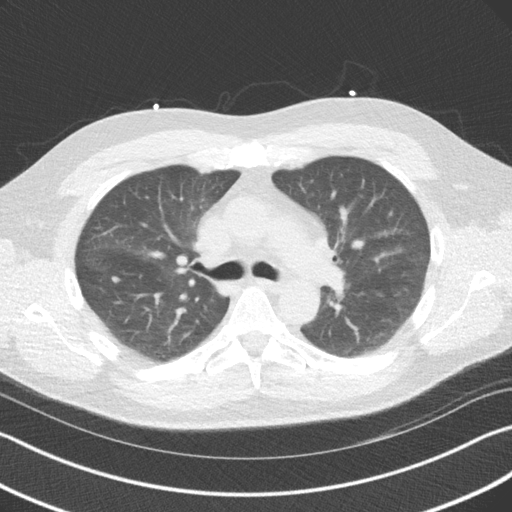

[14 of 20 positions shown; findings below may reference images not displayed]

FINDINGS: Atherosclerotic calcifications in the thoracic aorta. Within the
visualized portions of the thorax there are no suspicious appearing
pulmonary nodules or masses, there is no acute consolidative
airspace disease, no pleural effusions, no pneumothorax and no
lymphadenopathy. Visualized portions of the upper abdomen
demonstrates multiple low-attenuation liver lesions, incompletely
characterized on today's non-contrast CT examination, but
statistically likely to represent cysts. There are no aggressive
appearing lytic or blastic lesions noted in the visualized portions
of the skeleton.
IMPRESSION: 1.  Aortic Atherosclerosis (PPX0I-WSG.G).
FINDINGS: Coronary arteries: Normal origins.

Coronary Calcium Score:

Left main: 0

Left anterior descending artery: 0

Left circumflex artery: 0

Right coronary artery: 0

Total: 0

Percentile:

Pericardium: Normal.

Ascending Aorta: Normal caliber.

Non-cardiac: See separate report from [REDACTED].
IMPRESSION: Coronary calcium score of 0.



If CAC=0, it is reasonable to withhold statin therapy and reassess
in 5 to 10 years, as long as higher risk conditions are absent
(diabetes mellitus, family history of premature CHD in first degree
relatives (males <55 years; females <65 years), cigarette smoking,
or LDL >=190 mg/dL).

If CAC is 1 to 99, it is reasonable to initiate statin therapy for
patients >=55 years of age.

If CAC is >=100 or >=75th percentile, it is reasonable to initiate
statin therapy at any age.

Cardiology referral should be considered for patients with CAC
scores >=400 or >=75th percentile.

*6044 AHA/ACC/AACVPR/AAPA/ABC/HIPOLITO/ADAM VALHEY/TIGER/Nxnxns/SEHOU/BERRIO/GODOY
Guideline on the Management of Blood Cholesterol: A Report of the
American College of Cardiology/American Heart Association Task Force
on Clinical Practice Guidelines. J Am Coll Cardiol.
7399;73(24):5515-5163.

*** End of Addendum ***
EXAM:
OVER-READ INTERPRETATION  CT CHEST

The following report is an over-read performed by radiologist Dr.
Dely Castle [REDACTED] on 06/10/2020. This
over-read does not include interpretation of cardiac or coronary
anatomy or pathology. The coronary calcium score interpretation by
the cardiologist is attached.
FINDINGS: Atherosclerotic calcifications in the thoracic aorta. Within the
visualized portions of the thorax there are no suspicious appearing
pulmonary nodules or masses, there is no acute consolidative
airspace disease, no pleural effusions, no pneumothorax and no
lymphadenopathy. Visualized portions of the upper abdomen
demonstrates multiple low-attenuation liver lesions, incompletely
characterized on today's non-contrast CT examination, but
statistically likely to represent cysts. There are no aggressive
appearing lytic or blastic lesions noted in the visualized portions
of the skeleton.
IMPRESSION: 1.  Aortic Atherosclerosis (PPX0I-WSG.G).

## 2021-09-09 LAB — LIPID PANEL
Chol/HDL Ratio: 3 ratio (ref 0.0–5.0)
Cholesterol, Total: 192 mg/dL (ref 100–199)
HDL: 65 mg/dL (ref >39)
LDL Chol Calc (NIH): 101 mg/dL — ABNORMAL HIGH (ref 0–99)
Triglycerides: 151 mg/dL — ABNORMAL HIGH (ref 0–149)
VLDL Cholesterol Cal: 26 mg/dL (ref 5–40)

## 2021-09-09 LAB — BASIC METABOLIC PANEL
BUN/Creatinine Ratio: 15 (ref 9–20)
BUN: 16 mg/dL (ref 6–24)
CO2: 26 mmol/L (ref 20–29)
Calcium: 8.6 mg/dL — ABNORMAL LOW (ref 8.7–10.2)
Chloride: 98 mmol/L (ref 96–106)
Creatinine, Ser: 1.07 mg/dL (ref 0.76–1.27)
Glucose: 83 mg/dL (ref 70–99)
Potassium: 4.3 mmol/L (ref 3.5–5.2)
Sodium: 141 mmol/L (ref 134–144)
eGFR: 80 mL/min/{1.73_m2} (ref >59)

## 2021-09-09 LAB — HEPATIC FUNCTION PANEL
ALT: 23 IU/L (ref 0–44)
AST: 23 IU/L (ref 0–40)
Albumin: 4.5 g/dL (ref 3.8–4.9)
Alkaline Phosphatase: 57 IU/L (ref 44–121)
Bilirubin Total: 0.4 mg/dL (ref 0.0–1.2)
Bilirubin, Direct: 0.13 mg/dL (ref 0.00–0.40)
Total Protein: 6.3 g/dL (ref 6.0–8.5)

## 2021-09-09 LAB — CBC WITH DIFFERENTIAL/PLATELET
Basophils Absolute: 0.1 10*3/uL (ref 0.0–0.2)
Basos: 1 %
EOS (ABSOLUTE): 0.2 10*3/uL (ref 0.0–0.4)
Eos: 2 %
Hematocrit: 46.8 % (ref 37.5–51.0)
Hemoglobin: 15.9 g/dL (ref 13.0–17.7)
Immature Grans (Abs): 0 10*3/uL (ref 0.0–0.1)
Immature Granulocytes: 0 %
Lymphocytes Absolute: 2.6 10*3/uL (ref 0.7–3.1)
Lymphs: 38 %
MCH: 30.5 pg (ref 26.6–33.0)
MCHC: 34 g/dL (ref 31.5–35.7)
MCV: 90 fL (ref 79–97)
Monocytes Absolute: 0.7 10*3/uL (ref 0.1–0.9)
Monocytes: 10 %
Neutrophils Absolute: 3.3 10*3/uL (ref 1.4–7.0)
Neutrophils: 49 %
Platelets: 236 10*3/uL (ref 150–450)
RBC: 5.21 x10E6/uL (ref 4.14–5.80)
RDW: 13.3 % (ref 11.6–15.4)
WBC: 6.8 10*3/uL (ref 3.4–10.8)

## 2021-09-09 LAB — PSA: Prostate Specific Ag, Serum: 2 ng/mL (ref 0.0–4.0)

## 2021-09-09 LAB — URIC ACID: Uric Acid: 4.9 mg/dL (ref 3.8–8.4)

## 2021-09-10 ENCOUNTER — Other Ambulatory Visit: Payer: Self-pay

## 2021-09-10 DIAGNOSIS — R7989 Other specified abnormal findings of blood chemistry: Secondary | ICD-10-CM

## 2021-09-29 ENCOUNTER — Other Ambulatory Visit: Payer: Self-pay | Admitting: Family Medicine

## 2021-10-07 LAB — BASIC METABOLIC PANEL
BUN/Creatinine Ratio: 18 (ref 9–20)
BUN: 18 mg/dL (ref 6–24)
CO2: 25 mmol/L (ref 20–29)
Calcium: 9.8 mg/dL (ref 8.7–10.2)
Chloride: 103 mmol/L (ref 96–106)
Creatinine, Ser: 1.02 mg/dL (ref 0.76–1.27)
Glucose: 96 mg/dL (ref 70–99)
Potassium: 4.3 mmol/L (ref 3.5–5.2)
Sodium: 140 mmol/L (ref 134–144)
eGFR: 85 mL/min/{1.73_m2} (ref >59)

## 2021-10-07 LAB — VITAMIN D 25 HYDROXY (VIT D DEFICIENCY, FRACTURES): Vit D, 25-Hydroxy: 56.2 ng/mL (ref 30.0–100.0)

## 2021-10-07 LAB — CALCIUM, IONIZED: Calcium, Ion: 5.4 mg/dL (ref 4.5–5.6)

## 2021-10-14 ENCOUNTER — Ambulatory Visit (INDEPENDENT_AMBULATORY_CARE_PROVIDER_SITE_OTHER): Payer: 59 | Admitting: Family Medicine

## 2021-10-14 ENCOUNTER — Encounter: Payer: Self-pay | Admitting: Family Medicine

## 2021-10-14 VITALS — BP 110/74 | HR 59 | Ht 68.0 in | Wt 196.0 lb

## 2021-10-14 DIAGNOSIS — Z0000 Encounter for general adult medical examination without abnormal findings: Secondary | ICD-10-CM | POA: Diagnosis not present

## 2021-10-14 MED ORDER — ALLOPURINOL 300 MG PO TABS: ORAL_TABLET | ORAL | 3 refills | Status: DC

## 2021-10-14 MED ORDER — AMLODIPINE BESYLATE 2.5 MG PO TABS: ORAL_TABLET | ORAL | 1 refills | Status: DC

## 2021-10-14 MED ORDER — COLCHICINE 0.6 MG PO TABS: ORAL_TABLET | ORAL | 4 refills | Status: AC

## 2021-10-14 MED ORDER — AMLODIPINE BESYLATE 2.5 MG PO TABS: ORAL_TABLET | ORAL | 3 refills | Status: DC

## 2021-10-14 MED ORDER — ALLOPURINOL 300 MG PO TABS: ORAL_TABLET | ORAL | 1 refills | Status: DC

## 2021-10-14 NOTE — Progress Notes (Signed)
Reminder placed and my chart message sent to patient.

## 2021-10-14 NOTE — Progress Notes (Signed)
   Subjective:    Patient ID: Robert Brooks, male    DOB: 01/02/1961, 61 y.o.   MRN: [id number]  HPI The patient comes in today for a wellness visit.    A review of their health history was completed.  A review of medications was also completed.  Any needed refills; Allopurinol, Amlodipine, Colchicine   Eating habits: none  Falls/  MVA accidents in past few months: none  Regular exercise: some walking  Specialist pt sees on regular basis: none  Preventative health issues were discussed.   Additional concerns:     Review of Systems     Objective:   Physical Exam  General-in no acute distress Eyes-no discharge Lungs-respiratory rate normal, CTA CV-no murmurs,RRR Extremities skin warm dry no edema Neuro grossly normal Behavior normal, alert  Prostate mildly enlarged but no tenderness soft no nodules     Assessment & Plan:  Adult wellness-complete.wellness physical was conducted today. Importance of diet and exercise were discussed in detail.  Importance of stress reduction and healthy living were discussed.  In addition to this a discussion regarding safety was also covered.  We also reviewed over immunizations and gave recommendations regarding current immunization needed for age.   In addition to this additional areas were also touched on including: Preventative health exams needed:  Colonoscopy 2024 We have put this into the reminder file Patient was advised yearly wellness exam  Blood pressure good control Cholesterol good control Healthy diet recommended Gout good control.  Patient encouraged to quit all tobacco forms including chewing tobacco/snuff

## 2022-04-19 ENCOUNTER — Other Ambulatory Visit: Payer: Self-pay | Admitting: Family Medicine

## 2022-05-04 ENCOUNTER — Other Ambulatory Visit: Payer: Self-pay | Admitting: Family Medicine

## 2022-05-19 ENCOUNTER — Encounter: Payer: Self-pay | Admitting: Internal Medicine

## 2022-06-15 ENCOUNTER — Other Ambulatory Visit: Payer: Self-pay | Admitting: *Deleted

## 2022-06-15 DIAGNOSIS — Z1211 Encounter for screening for malignant neoplasm of colon: Secondary | ICD-10-CM

## 2022-07-03 ENCOUNTER — Other Ambulatory Visit: Payer: Self-pay | Admitting: Family Medicine

## 2022-07-15 ENCOUNTER — Ambulatory Visit: Payer: 59 | Admitting: Dermatology

## 2022-07-15 ENCOUNTER — Encounter: Payer: Self-pay | Admitting: Dermatology

## 2022-07-15 VITALS — BP 119/63 | HR 75

## 2022-07-15 DIAGNOSIS — L570 Actinic keratosis: Secondary | ICD-10-CM | POA: Diagnosis not present

## 2022-07-15 DIAGNOSIS — Z1283 Encounter for screening for malignant neoplasm of skin: Secondary | ICD-10-CM

## 2022-07-15 DIAGNOSIS — L814 Other melanin hyperpigmentation: Secondary | ICD-10-CM

## 2022-07-15 DIAGNOSIS — L738 Other specified follicular disorders: Secondary | ICD-10-CM | POA: Diagnosis not present

## 2022-07-15 DIAGNOSIS — L818 Other specified disorders of pigmentation: Secondary | ICD-10-CM

## 2022-07-15 DIAGNOSIS — L578 Other skin changes due to chronic exposure to nonionizing radiation: Secondary | ICD-10-CM

## 2022-07-15 DIAGNOSIS — D692 Other nonthrombocytopenic purpura: Secondary | ICD-10-CM

## 2022-07-15 DIAGNOSIS — X32XXXA Exposure to sunlight, initial encounter: Secondary | ICD-10-CM

## 2022-07-15 DIAGNOSIS — L821 Other seborrheic keratosis: Secondary | ICD-10-CM

## 2022-07-15 DIAGNOSIS — W908XXA Exposure to other nonionizing radiation, initial encounter: Secondary | ICD-10-CM

## 2022-07-15 DIAGNOSIS — D1801 Hemangioma of skin and subcutaneous tissue: Secondary | ICD-10-CM

## 2022-07-15 NOTE — Patient Instructions (Addendum)
Actinic keratoses are precancerous spots that appear secondary to cumulative UV radiation exposure/sun exposure over time. They are chronic with expected duration over 1 year. A portion of actinic keratoses will progress to squamous cell carcinoma of the skin. It is not possible to reliably predict which spots will progress to skin cancer and so treatment is recommended to prevent development of skin cancer.  Recommend daily broad spectrum sunscreen SPF 30+ to sun-exposed areas, reapply every 2 hours as needed.  Recommend staying in the shade or wearing long sleeves, sun glasses (UVA+UVB protection) and wide brim hats (4-inch brim around the entire circumference of the hat). Call for new or changing lesions.    Cryotherapy Aftercare  Wash gently with soap and water everyday.   Apply Vaseline and Band-Aid daily until healed.        Melanoma ABCDEs  Melanoma is the most dangerous type of skin cancer, and is the leading cause of death from skin disease.  You are more likely to develop melanoma if you: Have light-colored skin, light-colored eyes, or red or blond hair Spend a lot of time in the sun Tan regularly, either outdoors or in a tanning bed Have had blistering sunburns, especially during childhood Have a close family member who has had a melanoma Have atypical moles or large birthmarks  Early detection of melanoma is key since treatment is typically straightforward and cure rates are extremely high if we catch it early.   The first sign of melanoma is often a change in a mole or a new dark spot.  The ABCDE system is a way of remembering the signs of melanoma.  A for asymmetry:  The two halves do not match. B for border:  The edges of the growth are irregular. C for color:  A mixture of colors are present instead of an even brown color. D for diameter:  Melanomas are usually (but not always) greater than 6mm - the size of a pencil eraser. E for evolution:  The spot keeps  changing in size, shape, and color.  Please check your skin once per month between visits. You can use a small mirror in front and a large mirror behind you to keep an eye on the back side or your body.   If you see any new or changing lesions before your next follow-up, please call to schedule a visit.  Please continue daily skin protection including broad spectrum sunscreen SPF 30+ to sun-exposed areas, reapplying every 2 hours as needed when you're outdoors.   Staying in the shade or wearing long sleeves, sun glasses (UVA+UVB protection) and wide brim hats (4-inch brim around the entire circumference of the hat) are also recommended for sun protection.    Due to recent changes in healthcare laws, you may see results of your pathology and/or laboratory studies on MyChart before the doctors have had a chance to review them. We understand that in some cases there may be results that are confusing or concerning to you. Please understand that not all results are received at the same time and often the doctors may need to interpret multiple results in order to provide you with the best plan of care or course of treatment. Therefore, we ask that you please give us 2 business days to thoroughly review all your results before contacting the office for clarification. Should we see a critical lab result, you will be contacted sooner.   If You Need Anything After Your Visit  If you have any questions   or concerns for your doctor, please call our main line at [phone] and press option 4 to reach your doctor's medical assistant. If no one answers, please leave a voicemail as directed and we will return your call as soon as possible. Messages left after 4 pm will be answered the following business day.   You may also send us a message via MyChart. We typically respond to MyChart messages within 1-2 business days.  For prescription refills, please ask your pharmacy to contact our office. Our fax number is  [phone].  If you have an urgent issue when the clinic is closed that cannot wait until the next business day, you can page your doctor at the number below.    Please note that while we do our best to be available for urgent issues outside of office hours, we are not available 24/7.   If you have an urgent issue and are unable to reach us, you may choose to seek medical care at your doctor's office, retail clinic, urgent care center, or emergency room.  If you have a medical emergency, please immediately call 911 or go to the emergency department.  Pager Numbers  - Dr. Kowalski: [phone]  - Dr. Moye: [phone]  - Dr. Stewart: [phone]  In the event of inclement weather, please call our main line at [phone] for an update on the status of any delays or closures.  Dermatology Medication Tips: Please keep the boxes that topical medications come in in order to help keep track of the instructions about where and how to use these. Pharmacies typically print the medication instructions only on the boxes and not directly on the medication tubes.   If your medication is too expensive, please contact our office at [phone] option 4 or send us a message through MyChart.   We are unable to tell what your co-pay for medications will be in advance as this is different depending on your insurance coverage. However, we may be able to find a substitute medication at lower cost or fill out paperwork to get insurance to cover a needed medication.   If a prior authorization is required to get your medication covered by your insurance company, please allow us 1-2 business days to complete this process.  Drug prices often vary depending on where the prescription is filled and some pharmacies may offer cheaper prices.  The website www.goodrx.com contains coupons for medications through different pharmacies. The prices here do not account for what the cost may be with help from  insurance (it may be cheaper with your insurance), but the website can give you the price if you did not use any insurance.  - You can print the associated coupon and take it with your prescription to the pharmacy.  - You may also stop by our office during regular business hours and pick up a GoodRx coupon card.  - If you need your prescription sent electronically to a different pharmacy, notify our office through [facility] MyChart or by phone at [phone] option 4.     Si Usted Necesita Algo Despus de Su Visita  Tambin puede enviarnos un mensaje a travs de MyChart. Por lo general respondemos a los mensajes de MyChart en el transcurso de 1 a 2 das hbiles.  Para renovar recetas, por favor pida a su farmacia que se ponga en contacto con nuestra oficina. Nuestro nmero de fax es el [phone].  Si tiene un asunto urgente cuando la clnica est cerrada y que no puede   esperar hasta el siguiente da hbil, puede llamar/localizar a su doctor(a) al nmero que aparece a continuacin.   Por favor, tenga en cuenta que aunque hacemos todo lo posible para estar disponibles para asuntos urgentes fuera del horario de oficina, no estamos disponibles las 24 horas del da, los 7 das de la semana.   Si tiene un problema urgente y no puede comunicarse con nosotros, puede optar por buscar atencin mdica  en el consultorio de su doctor(a), en una clnica privada, en un centro de atencin urgente o en una sala de emergencias.  Si tiene una emergencia mdica, por favor llame inmediatamente al 911 o vaya a la sala de emergencias.  Nmeros de bper  - Dr. Kowalski: [phone]  - Dra. Moye: [phone]  - Dra. Stewart: [phone]  En caso de inclemencias del tiempo, por favor llame a nuestra lnea principal al [phone] para una actualizacin sobre el estado de cualquier retraso o cierre.  Consejos para la medicacin en dermatologa: Por favor, guarde las cajas en las que vienen los  medicamentos de uso tpico para ayudarle a seguir las instrucciones sobre dnde y cmo usarlos. Las farmacias generalmente imprimen las instrucciones del medicamento slo en las cajas y no directamente en los tubos del medicamento.   Si su medicamento es muy caro, por favor, pngase en contacto con nuestra oficina llamando al [phone] y presione la opcin 4 o envenos un mensaje a travs de MyChart.   No podemos decirle cul ser su copago por los medicamentos por adelantado ya que esto es diferente dependiendo de la cobertura de su seguro. Sin embargo, es posible que podamos encontrar un medicamento sustituto a menor costo o llenar un formulario para que el seguro cubra el medicamento que se considera necesario.   Si se requiere una autorizacin previa para que su compaa de seguros cubra su medicamento, por favor permtanos de 1 a 2 das hbiles para completar este proceso.  Los precios de los medicamentos varan con frecuencia dependiendo del lugar de dnde se surte la receta y alguna farmacias pueden ofrecer precios ms baratos.  El sitio web www.goodrx.com tiene cupones para medicamentos de diferentes farmacias. Los precios aqu no tienen en cuenta lo que podra costar con la ayuda del seguro (puede ser ms barato con su seguro), pero el sitio web puede darle el precio si no utiliz ningn seguro.  - Puede imprimir el cupn correspondiente y llevarlo con su receta a la farmacia.  - Tambin puede pasar por nuestra oficina durante el horario de atencin regular y recoger una tarjeta de cupones de GoodRx.  - Si necesita que su receta se enve electrnicamente a una farmacia diferente, informe a nuestra oficina a travs de MyChart de [facility] o por telfono llamando al [phone] y presione la opcin 4.  

## 2022-07-15 NOTE — Progress Notes (Signed)
Follow-Up Visit   Subjective  Robert Brooks is a 62 y.o. male who presents for the following: Skin Cancer Screening and Full Body Skin Exam The patient presents for Total-Body Skin Exam (TBSE) for skin cancer screening and mole check. The patient has spots, moles and lesions to be evaluated, some may be new or changing and the patient has concerns that these could be cancer.  The following portions of the chart were reviewed this encounter and updated as appropriate: medications, allergies, medical history  Review of Systems:  No other skin or systemic complaints except as noted in HPI or Assessment and Plan.  Objective  Well appearing patient in no apparent distress; mood and affect are within normal limits.  A full examination was performed including scalp, head, eyes, ears, nose, lips, neck, chest, axillae, abdomen, back, buttocks, bilateral upper extremities, bilateral lower extremities, hands, feet, fingers, toes, fingernails, and toenails. All findings within normal limits unless otherwise noted below.   Relevant physical exam findings are noted in the Assessment and Plan.  face x 1 Erythematous thin papules/macules with gritty scale.    Assessment & Plan    Sebaceous Hyperplasia - Small yellow papules with a central dell - Benign-appearing - Observe. Call for changes.  Purpura - Chronic; persistent and recurrent.  Treatable, but not curable. - Violaceous macules and patches - Benign - Related to trauma, age, sun damage and/or use of blood thinners, chronic use of topical and/or oral steroids - Observe - Can use OTC arnica containing moisturizer such as Dermend Bruise Formula if desired - Call for worsening or other concerns  LENTIGINES, SEBORRHEIC KERATOSES, HEMANGIOMAS - Benign normal skin lesions - Benign-appearing - Call for any changes  MELANOCYTIC NEVI - Tan-brown and/or pink-flesh-colored symmetric macules and papules - Benign appearing on exam today -  Observation - Call clinic for new or changing moles - Recommend daily use of broad spectrum spf 30+ sunscreen to sun-exposed areas.   ACTINIC DAMAGE - Chronic condition, secondary to cumulative UV/sun exposure - diffuse scaly erythematous macules with underlying dyspigmentation - Recommend daily broad spectrum sunscreen SPF 30+ to sun-exposed areas, reapply every 2 hours as needed.  - Staying in the shade or wearing long sleeves, sun glasses (UVA+UVB protection) and wide brim hats (4-inch brim around the entire circumference of the hat) are also recommended for sun protection.  - Call for new or changing lesions.  SKIN CANCER SCREENING PERFORMED TODAY.  Actinic keratosis face x 1  Actinic keratoses are precancerous spots that appear secondary to cumulative UV radiation exposure/sun exposure over time. They are chronic with expected duration over 1 year. A portion of actinic keratoses will progress to squamous cell carcinoma of the skin. It is not possible to reliably predict which spots will progress to skin cancer and so treatment is recommended to prevent development of skin cancer.  Recommend daily broad spectrum sunscreen SPF 30+ to sun-exposed areas, reapply every 2 hours as needed.  Recommend staying in the shade or wearing long sleeves, sun glasses (UVA+UVB protection) and wide brim hats (4-inch brim around the entire circumference of the hat). Call for new or changing lesions.  Destruction of lesion - face x 1 Complexity: simple   Destruction method: cryotherapy   Informed consent: discussed and consent obtained   Timeout:  patient name, date of birth, surgical site, and procedure verified Lesion destroyed using liquid nitrogen: Yes   Region frozen until ice ball extended beyond lesion: Yes   Outcome: patient tolerated procedure well  with no complications   Post-procedure details: wound care instructions given   Additional details:  Prior to procedure, discussed risks of  blister formation, small wound, skin dyspigmentation, or rare scar following cryotherapy. Recommend Vaseline ointment to treated areas while healing.   Idiopathic guttate hypomelanosis b/l legs  Benign. Observe     Return in about 1 year (around 07/15/2023) for TBSE.  IAsher Muir, CMA, am acting as scribe for Armida Sans, MD.  Documentation: I have reviewed the above documentation for accuracy and completeness, and I agree with the above.  Armida Sans, MD

## 2022-07-19 ENCOUNTER — Ambulatory Visit (AMBULATORY_SURGERY_CENTER): Payer: 59

## 2022-07-19 VITALS — Ht 70.0 in | Wt 195.0 lb

## 2022-07-19 DIAGNOSIS — Z86010 Personal history of colonic polyps: Secondary | ICD-10-CM

## 2022-07-19 DIAGNOSIS — Z800 Family history of malignant neoplasm of digestive organs: Secondary | ICD-10-CM

## 2022-07-19 MED ORDER — NA SULFATE-K SULFATE-MG SULF 17.5-3.13-1.6 GM/177ML PO SOLN: 1.0000 | Freq: Once | ORAL | 0 refills | Status: AC

## 2022-07-19 NOTE — Progress Notes (Signed)
Pre visit completed via phone call; Patient verified name, DOB, and address;  No egg or soy allergy known to patient;  No issues known to pt with past sedation with any surgeries or procedures; Patient denies ever being told they had issues or difficulty with intubation;  No FH of Malignant Hyperthermia; Pt is not on diet pills; Pt is not on home 02;  Pt is not on blood thinners;  Pt denies issues with constipation  No A fib or A flutter; Have any cardiac testing pending--NO Pt instructed to use Singlecare[employer]com or GoodRx for a price reduction on prep   Insurance verified during PV appt=[employer]  Patient's chart reviewed by Cathlyn Parsons CNRA prior to previsit and patient appropriate for the [facility].  Previsit completed and red dot placed by patient's name on their procedure day (on provider's schedule).    Instructions sent to patient via MyChart per patient request;

## 2022-07-21 ENCOUNTER — Encounter: Payer: Self-pay | Admitting: Internal Medicine

## 2022-07-21 ENCOUNTER — Encounter: Payer: Self-pay | Admitting: Dermatology

## 2022-08-02 ENCOUNTER — Encounter: Payer: Self-pay | Admitting: Internal Medicine

## 2022-08-02 ENCOUNTER — Ambulatory Visit (AMBULATORY_SURGERY_CENTER): Payer: 59 | Admitting: Internal Medicine

## 2022-08-02 VITALS — BP 116/72 | HR 63 | Temp 97.7°F | Resp 10 | Ht 70.0 in | Wt 195.0 lb

## 2022-08-02 DIAGNOSIS — Z09 Encounter for follow-up examination after completed treatment for conditions other than malignant neoplasm: Secondary | ICD-10-CM | POA: Diagnosis present

## 2022-08-02 DIAGNOSIS — D122 Benign neoplasm of ascending colon: Secondary | ICD-10-CM

## 2022-08-02 DIAGNOSIS — D128 Benign neoplasm of rectum: Secondary | ICD-10-CM

## 2022-08-02 DIAGNOSIS — Z800 Family history of malignant neoplasm of digestive organs: Secondary | ICD-10-CM

## 2022-08-02 DIAGNOSIS — Z86010 Personal history of colonic polyps: Secondary | ICD-10-CM

## 2022-08-02 DIAGNOSIS — D123 Benign neoplasm of transverse colon: Secondary | ICD-10-CM

## 2022-08-02 DIAGNOSIS — K635 Polyp of colon: Secondary | ICD-10-CM

## 2022-08-02 MED ORDER — SODIUM CHLORIDE 0.9 % IV SOLN
500.0000 mL | Freq: Once | INTRAVENOUS | Status: DC
Start: 2022-08-02 — End: 2022-08-02

## 2022-08-02 NOTE — Patient Instructions (Addendum)
Resume previous diet Continue present medications Await pathology results No NSAIDS (Non-Steroidal anti-inflammatory drugs) for 2 weeks.  (These include, aspirin, aspirin-containing products, ibuprofen, advil, motrin, naproxen, aleve, goody powders, etc) Tylenol is ok to take as needed, see label for instructions. Handouts/information given for polyps, diverticulosis and hemorrhoids  YOU HAD AN ENDOSCOPIC PROCEDURE TODAY AT THE [facility]:   Refer to the procedure report that was given to you for any specific questions about what was found during the examination.  If the procedure report does not answer your questions, please call your gastroenterologist to clarify.  If you requested that your care partner not be given the details of your procedure findings, then the procedure report has been included in a sealed envelope for you to review at your convenience later.  YOU SHOULD EXPECT: Some feelings of bloating in the abdomen. Passage of more gas than usual.  Walking can help get rid of the air that was put into your GI tract during the procedure and reduce the bloating. If you had a lower endoscopy (such as a colonoscopy or flexible sigmoidoscopy) you may notice spotting of blood in your stool or on the toilet paper. If you underwent a bowel prep for your procedure, you may not have a normal bowel movement for a few days.  Please Note:  You might notice some irritation and congestion in your nose or some drainage.  This is from the oxygen used during your procedure.  There is no need for concern and it should clear up in a day or so.  SYMPTOMS TO REPORT IMMEDIATELY: Following lower endoscopy (colonoscopy):  Excessive amounts of blood in the stool  Significant tenderness or worsening of abdominal pains  Swelling of the abdomen that is new, acute  Fever of 100F or higher  For urgent or emergent issues, a gastroenterologist can be reached at any hour by calling [phone]. Do not  use MyChart messaging for urgent concerns.   DIET:  We do recommend a small meal at first, but then you may proceed to your regular diet.  Drink plenty of fluids but you should avoid alcoholic beverages for 24 hours.  ACTIVITY:  You should plan to take it easy for the rest of today and you should NOT DRIVE or use heavy machinery until tomorrow (because of the sedation medicines used during the test).    FOLLOW UP: Our staff will call the number listed on your records the next business day following your procedure.  We will call around 7:15- 8:00 am to check on you and address any questions or concerns that you may have regarding the information given to you following your procedure. If we do not reach you, we will leave a message.     If any biopsies were taken you will be contacted by phone or by letter within the next 1-3 weeks.  Please call us at [phone] if you have not heard about the biopsies in 3 weeks.    SIGNATURES/CONFIDENTIALITY: You and/or your care partner have signed paperwork which will be entered into your electronic medical record.  These signatures attest to the fact that that the information above on your After Visit Summary has been reviewed and is understood.  Full responsibility of the confidentiality of this discharge information lies with you and/or your care-partner.

## 2022-08-02 NOTE — Progress Notes (Signed)
Pt resting comfortably. VSS. Airway intact. SBAR complete to RN. All questions answered.   

## 2022-08-02 NOTE — Progress Notes (Signed)
Pt's states no medical or surgical changes since previsit or office visit. 

## 2022-08-02 NOTE — Progress Notes (Signed)
GASTROENTEROLOGY PROCEDURE H&P NOTE   Primary Care Physician: Babs Sciara, MD    Reason for Procedure:  Personal history of adenomatous colon polyp and family history of colon cancer patient's mother  Plan:    Colonoscopy  Patient is appropriate for endoscopic procedure(s) in the ambulatory ([facility]) setting.  The nature of the procedure, as well as the risks, benefits, and alternatives were carefully and thoroughly reviewed with the patient. Ample time for discussion and questions allowed. The patient understood, was satisfied, and agreed to proceed.     HPI: Robert Brooks is a 62 y.o. male who presents for surveillance colonoscopy.  Medical history as below.  Tolerated the prep.  No recent chest pain or shortness of breath.  No abdominal pain today.  Past Medical History:  Diagnosis Date   Bursitis    left shoulder   Gout    Hypertension    on meds   Tingling     Past Surgical History:  Procedure Laterality Date   COLONOSCOPY  2019   JMP-MAC-suprep(good)-int hems/tics/   TONSILLECTOMY      Prior to Admission medications   Medication Sig Start Date End Date Taking? Authorizing Provider  allopurinol (ZYLOPRIM) 300 MG tablet TAKE 1 TABLET BY MOUTH DAILY 07/05/22  Yes Luking, Scott A, MD  amLODipine (NORVASC) 2.5 MG tablet TAKE 1 TABLET BY MOUTH DAILY 10/14/21  Yes Luking, Jonna Coup, MD  acetaminophen (TYLENOL) 325 MG tablet Take 650 mg by mouth every 6 (six) hours as needed.    [provider]  colchicine 0.6 MG tablet TAKE 1 TABLET(0.6 MG) BY MOUTH TWICE DAILY PRN Gout Patient not taking: Reported on 07/19/2022 10/14/21   Babs Sciara, MD  indomethacin (INDOCIN) 50 MG capsule Take 1 capsule (50 mg total) by mouth 3 (three) times daily as needed. Patient not taking: Reported on 07/19/2022 06/14/17   Babs Sciara, MD    Current Outpatient Medications  Medication Sig Dispense Refill   allopurinol (ZYLOPRIM) 300 MG tablet TAKE 1 TABLET BY MOUTH DAILY 90 tablet  1   amLODipine (NORVASC) 2.5 MG tablet TAKE 1 TABLET BY MOUTH DAILY 90 tablet 3   acetaminophen (TYLENOL) 325 MG tablet Take 650 mg by mouth every 6 (six) hours as needed.     colchicine 0.6 MG tablet TAKE 1 TABLET(0.6 MG) BY MOUTH TWICE DAILY PRN Gout (Patient not taking: Reported on 07/19/2022) 90 tablet 4   indomethacin (INDOCIN) 50 MG capsule Take 1 capsule (50 mg total) by mouth 3 (three) times daily as needed. (Patient not taking: Reported on 07/19/2022) 30 capsule 4   Current Facility-Administered Medications  Medication Dose Route Frequency Provider Last Rate Last Admin   0.9 %  sodium chloride infusion  500 mL Intravenous Once Cauy Melody, Carie Caddy, MD        Allergies as of 08/02/2022   (No Known Allergies)    Family History  Problem Relation Age of Onset   Colon polyps Mother 75   Colon cancer Mother 67   Diabetes Mother    Heart disease Father    Diabetes Father    Congestive Heart Failure Father    Esophageal cancer Neg Hx    Rectal cancer Neg Hx    Stomach cancer Neg Hx     Social History   Socioeconomic History   Marital status: Married    Spouse name: Not on file   Number of children: 2   Years of education: college   Highest education level:  Not on file  Occupational History   Occupation: Patent examiner  Tobacco Use   Smoking status: Never   Smokeless tobacco: Current    Types: Snuff  Vaping Use   Vaping Use: Never used  Substance and Sexual Activity   Alcohol use: Yes    Alcohol/week: 0.0 - 6.0 standard drinks of alcohol    Comment: socially   Drug use: No   Sexual activity: Not on file  Other Topics Concern   Not on file  Social History Narrative   Lives at home with his wife.   Right-handed.   Two cups caffeine per day.   Social Determinants of Health   Financial Resource Strain: Not on file  Food Insecurity: Not on file  Transportation Needs: Not on file  Physical Activity: Not on file  Stress: Not on file  Social Connections: Not on file   Intimate Partner Violence: Not on file    Physical Exam: Vital signs in last 24 hours: @BP  131/80   Pulse 70   Temp 97.7 F (36.5 C)   Ht 5\' 10"  (1.778 m)   Wt 195 lb (88.5 kg)   BMI 27.98 kg/m  GEN: NAD EYE: Sclerae anicteric ENT: MMM CV: Non-tachycardic Pulm: CTA b/l GI: Soft, NT/ND NEURO:  Alert & Oriented x 3   Erick Blinks, MD [facility]  08/02/2022 8:26 AM

## 2022-08-02 NOTE — Op Note (Signed)
[facility] Patient Name: Robert Brooks Procedure Date: 08/02/2022 8:31 AM MRN: [id number] Endoscopist: Beverley Fiedler , MD, [id number] Age: 62 Referring MD:  Date of Birth: [birth year] Gender: Male Account #: [id number] Procedure:                Colonoscopy Indications:              High risk colon cancer surveillance: Personal                            history of non-advanced adenoma, Family history of                            colon cancer in a first-degree relative before age                            64 years, Last colonoscopy: May 2019 Medicines:                Propofol per Anesthesia Procedure:                Pre-Anesthesia Assessment:                           - Prior to the procedure, a History and Physical                            was performed, and patient medications and                            allergies were reviewed. The patient's tolerance of                            previous anesthesia was also reviewed. The risks                            and benefits of the procedure and the sedation                            options and risks were discussed with the patient.                            All questions were answered, and informed consent                            was obtained. Prior Anticoagulants: The patient has                            taken no anticoagulant or antiplatelet agents. ASA                            Grade Assessment: II - A patient with mild systemic                            disease. After reviewing the risks and benefits,  the patient was deemed in satisfactory condition to                            undergo the procedure.                           After obtaining informed consent, the colonoscope                            was passed under direct vision. Throughout the                            procedure, the patient's blood pressure, pulse, and                            oxygen saturations were  monitored continuously. The                            CF [id number] #[id number] was introduced through the anus                            and advanced to the cecum, identified by                            appendiceal orifice and ileocecal valve. The                            colonoscopy was performed without difficulty. The                            patient tolerated the procedure well. The quality                            of the bowel preparation was excellent. The                            ileocecal valve, appendiceal orifice, and rectum                            were photographed. Scope In: 8:38:39 AM Scope Out: 8:56:08 AM Scope Withdrawal Time: 0 hours 14 minutes 50 seconds  Total Procedure Duration: 0 hours 17 minutes 29 seconds  Findings:                 The digital rectal exam was normal.                           Two sessile polyps were found in the ascending                            colon. The polyps were 3 to 4 mm in size. These                            polyps were removed with a cold snare. Resection  and retrieval were complete.                           A 12 mm polyp was found in the proximal rectum. The                            polyp was pedunculated. The polyp was removed with                            a hot snare. Resection and retrieval were complete.                           Multiple large-mouthed, medium-mouthed and                            small-mouthed diverticula were found in the sigmoid                            colon and descending colon.                           Internal hemorrhoids were found during                            retroflexion. The hemorrhoids were small. Complications:            No immediate complications. Estimated Blood Loss:     Estimated blood loss: none. Impression:               - Two 3 to 4 mm polyps in the ascending colon,                            removed with a cold snare. Resected and  retrieved.                           - One 12 mm polyp in the proximal rectum, removed                            with a hot snare. Resected and retrieved.                           - Mild diverticulosis in the sigmoid colon and in                            the descending colon.                           - Small internal hemorrhoids. Recommendation:           - Patient has a contact number available for                            emergencies. The signs and symptoms of potential  delayed complications were discussed with the                            patient. Return to normal activities tomorrow.                            Written discharge instructions were provided to the                            patient.                           - Resume previous diet.                           - Continue present medications.                           - Await pathology results.                           - No aspirin, ibuprofen, naproxen, or other                            non-steroidal anti-inflammatory drugs for 2 weeks                            after polyp removal.                           - Repeat colonoscopy is recommended for                            surveillance. The colonoscopy date will be                            determined after pathology results from today's                            exam become available for review. Beverley Fiedler, MD 08/02/2022 9:11:26 AM This report has been signed electronically.

## 2022-08-02 NOTE — Progress Notes (Signed)
Called to room to assist during endoscopic procedure.  Patient ID and intended procedure confirmed with present staff. Received instructions for my participation in the procedure from the performing physician.  

## 2022-08-03 ENCOUNTER — Telehealth: Payer: Self-pay

## 2022-08-03 NOTE — Telephone Encounter (Signed)
Follow up call to pt, lm for pt to call if having any difficulty with normal activities or eating and drinking.  Also to call if any other questions or concerns.  

## 2022-08-05 ENCOUNTER — Encounter: Payer: Self-pay | Admitting: Internal Medicine

## 2022-08-23 ENCOUNTER — Encounter: Payer: Self-pay | Admitting: Family Medicine

## 2022-08-25 NOTE — Telephone Encounter (Signed)
Nurses I recommend lipid, liver, metabolic 7, uric acid, urine ACR, CBC, PSA  Wellness, screening prostate, hypertension, hyperlipidemia  He can do these labs approximately 1 to 2 weeks before his wellness exam  Thanks-Dr. Lorin Picket

## 2022-08-26 ENCOUNTER — Other Ambulatory Visit: Payer: Self-pay

## 2022-08-26 ENCOUNTER — Telehealth: Payer: Self-pay | Admitting: Family Medicine

## 2022-08-26 DIAGNOSIS — Z0000 Encounter for general adult medical examination without abnormal findings: Secondary | ICD-10-CM

## 2022-08-26 DIAGNOSIS — E7849 Other hyperlipidemia: Secondary | ICD-10-CM

## 2022-08-26 DIAGNOSIS — E782 Mixed hyperlipidemia: Secondary | ICD-10-CM

## 2022-08-26 DIAGNOSIS — I10 Essential (primary) hypertension: Secondary | ICD-10-CM

## 2022-08-26 DIAGNOSIS — Z8739 Personal history of other diseases of the musculoskeletal system and connective tissue: Secondary | ICD-10-CM

## 2022-08-26 DIAGNOSIS — Z125 Encounter for screening for malignant neoplasm of prostate: Secondary | ICD-10-CM

## 2022-08-26 NOTE — Telephone Encounter (Signed)
Patient would like lab order to get them done in the next two weeks. He states sent my chart message but no response about getting lab papers. Please advise

## 2022-08-27 NOTE — Telephone Encounter (Signed)
Lab orders put in patient notified

## 2022-09-29 LAB — LIPID PANEL
Chol/HDL Ratio: 3.4 ratio (ref 0.0–5.0)
Cholesterol, Total: 245 mg/dL — ABNORMAL HIGH (ref 100–199)
HDL: 73 mg/dL (ref >39)
LDL Chol Calc (NIH): 152 mg/dL — ABNORMAL HIGH (ref 0–99)
Triglycerides: 114 mg/dL (ref 0–149)
VLDL Cholesterol Cal: 20 mg/dL (ref 5–40)

## 2022-09-29 LAB — BASIC METABOLIC PANEL
BUN/Creatinine Ratio: 15 (ref 10–24)
BUN: 18 mg/dL (ref 8–27)
CO2: 21 mmol/L (ref 20–29)
Calcium: 9.5 mg/dL (ref 8.6–10.2)
Chloride: 104 mmol/L (ref 96–106)
Creatinine, Ser: 1.18 mg/dL (ref 0.76–1.27)
Glucose: 84 mg/dL (ref 70–99)
Potassium: 4.9 mmol/L (ref 3.5–5.2)
Sodium: 141 mmol/L (ref 134–144)
eGFR: 71 mL/min/{1.73_m2} (ref >59)

## 2022-09-29 LAB — MICROALBUMIN / CREATININE URINE RATIO
Creatinine, Urine: 393.5 mg/dL
Microalb/Creat Ratio: 7 mg/g creat (ref 0–29)
Microalbumin, Urine: 26.5 ug/mL

## 2022-09-29 LAB — PSA: Prostate Specific Ag, Serum: 1.1 ng/mL (ref 0.0–4.0)

## 2022-09-29 LAB — HEPATIC FUNCTION PANEL
ALT: 25 IU/L (ref 0–44)
AST: 25 IU/L (ref 0–40)
Albumin: 4.7 g/dL (ref 3.8–4.9)
Alkaline Phosphatase: 66 IU/L (ref 44–121)
Bilirubin Total: 1 mg/dL (ref 0.0–1.2)
Bilirubin, Direct: 0.22 mg/dL (ref 0.00–0.40)
Total Protein: 6.7 g/dL (ref 6.0–8.5)

## 2022-09-29 LAB — URIC ACID: Uric Acid: 4.9 mg/dL (ref 3.8–8.4)

## 2022-10-09 ENCOUNTER — Other Ambulatory Visit: Payer: Self-pay | Admitting: Family Medicine

## 2022-10-18 ENCOUNTER — Ambulatory Visit (INDEPENDENT_AMBULATORY_CARE_PROVIDER_SITE_OTHER): Payer: 59 | Admitting: Family Medicine

## 2022-10-18 ENCOUNTER — Encounter: Payer: Self-pay | Admitting: Family Medicine

## 2022-10-18 VITALS — BP 129/85 | HR 79 | Temp 97.7°F | Ht 70.0 in | Wt 208.0 lb

## 2022-10-18 DIAGNOSIS — Z8739 Personal history of other diseases of the musculoskeletal system and connective tissue: Secondary | ICD-10-CM

## 2022-10-18 DIAGNOSIS — E7849 Other hyperlipidemia: Secondary | ICD-10-CM | POA: Diagnosis not present

## 2022-10-18 DIAGNOSIS — I10 Essential (primary) hypertension: Secondary | ICD-10-CM

## 2022-10-18 DIAGNOSIS — Z0000 Encounter for general adult medical examination without abnormal findings: Secondary | ICD-10-CM

## 2022-10-18 DIAGNOSIS — Z0001 Encounter for general adult medical examination with abnormal findings: Secondary | ICD-10-CM | POA: Diagnosis not present

## 2022-10-18 MED ORDER — AMLODIPINE BESYLATE 2.5 MG PO TABS: ORAL_TABLET | ORAL | 3 refills | Status: DC

## 2022-10-18 MED ORDER — ALLOPURINOL 300 MG PO TABS: ORAL_TABLET | ORAL | 3 refills | Status: DC

## 2022-10-18 NOTE — Progress Notes (Signed)
Subjective:    Patient ID: Robert Brooks, male    DOB: 29-Mar-1960, 62 y.o.   MRN: [id number]  HPI The patient comes in today for a wellness visit. Patient tries to maintain good health for the most part Had coronary calcium a few years ago which was normal  Results for orders placed or performed in visit on 08/26/22  Lipid Panel  Result Value Ref Range   Cholesterol, Total 245 (H) 100 - 199 mg/dL   Triglycerides 546 0 - 149 mg/dL   HDL 73 >27 mg/dL   VLDL Cholesterol Cal 20 5 - 40 mg/dL   LDL Chol Calc (NIH) 035 (H) 0 - 99 mg/dL   Chol/HDL Ratio 3.4 0.0 - 5.0 ratio  Hepatic Function Panel  Result Value Ref Range   Total Protein 6.7 6.0 - 8.5 g/dL   Albumin 4.7 3.8 - 4.9 g/dL   Bilirubin Total 1.0 0.0 - 1.2 mg/dL   Bilirubin, Direct 0.09 0.00 - 0.40 mg/dL   Alkaline Phosphatase 66 44 - 121 IU/L   AST 25 0 - 40 IU/L   ALT 25 0 - 44 IU/L  Basic Metabolic Panel  Result Value Ref Range   Glucose 84 70 - 99 mg/dL   BUN 18 8 - 27 mg/dL   Creatinine, Ser 3.81 0.76 - 1.27 mg/dL   eGFR 71 >82 XH/BZJ/6.96   BUN/Creatinine Ratio 15 10 - 24   Sodium 141 134 - 144 mmol/L   Potassium 4.9 3.5 - 5.2 mmol/L   Chloride 104 96 - 106 mmol/L   CO2 21 20 - 29 mmol/L   Calcium 9.5 8.6 - 10.2 mg/dL  Microalbumin/Creatinine Ratio, Urine  Result Value Ref Range   Creatinine, Urine 393.5 Not Estab. mg/dL   Microalbumin, Urine 78.9 Not Estab. ug/mL   Microalb/Creat Ratio 7 0 - 29 mg/g creat  PSA  Result Value Ref Range   Prostate Specific Ag, Serum 1.1 0.0 - 4.0 ng/mL  Uric Acid  Result Value Ref Range   Uric Acid 4.9 3.8 - 8.4 mg/dL    A review of their health history was completed.  A review of medications was also completed.  Any needed refills; allopurinol amlodipine The 10-year ASCVD risk score (Arnett DK, et al., 2019) is: 9.3%   Values used to calculate the score:     Age: 26 years     Sex: Male     Is Non-Hispanic African American: No     Diabetic: No     Tobacco smoker:  No     Systolic Blood Pressure: 129 mmHg     Is BP treated: Yes     HDL Cholesterol: 73 mg/dL     Total Cholesterol: 245 mg/dL  Eating habits: good  Falls/  MVA accidents in past few months: no  Regular exercise: walking and works daily  Specialist pt sees on regular basis: none  Preventative health issues were discussed.   Additional concerns: discuss lab results     Review of Systems     Objective:   Physical Exam  General-in no acute distress Eyes-no discharge Lungs-respiratory rate normal, CTA CV-no murmurs,RRR Extremities skin warm dry no edema Neuro grossly normal Behavior normal, alert       Assessment & Plan:  1. Well adult exam Adult wellness-complete.wellness physical was conducted today. Importance of diet and exercise were discussed in detail.  Importance of stress reduction and healthy living were discussed.  In addition to this a discussion regarding safety was  also covered.  We also reviewed over immunizations and gave recommendations regarding current immunization needed for age.   In addition to this additional areas were also touched on including: Preventative health exams needed:  Colonoscopy 2027  Patient was advised yearly wellness exam   2. History of gout Uric acid number looks good.  Continue medication  3. Other hyperlipidemia LDL elevated but HDL excellent recent coronary calcium negative recheck lipid in 6 months hold off on medications currently joint discussion patient does have family history of elevated cholesterol If he can improve the numbers we will stay without medicine that the numbers go up in 6 months he will be on rosuvastatin  4. Essential hypertension HTN- patient seen for follow-up regarding HTN.   Diet, medication compliance, appropriate labs and refills were completed.   Importance of keeping blood pressure under good control to lessen the risk of complications discussed Regular follow-up visits  discussed

## 2023-04-06 LAB — LIPID PANEL
Chol/HDL Ratio: 3.4 {ratio} (ref 0.0–5.0)
Cholesterol, Total: 208 mg/dL — ABNORMAL HIGH (ref 100–199)
HDL: 61 mg/dL (ref >39)
LDL Chol Calc (NIH): 109 mg/dL — ABNORMAL HIGH (ref 0–99)
Triglycerides: 220 mg/dL — ABNORMAL HIGH (ref 0–149)
VLDL Cholesterol Cal: 38 mg/dL (ref 5–40)

## 2023-04-08 ENCOUNTER — Encounter: Payer: Self-pay | Admitting: Family Medicine

## 2023-07-20 ENCOUNTER — Ambulatory Visit: Payer: 59 | Admitting: Dermatology

## 2023-08-22 ENCOUNTER — Encounter: Payer: Self-pay | Admitting: Dermatology

## 2023-08-22 ENCOUNTER — Ambulatory Visit: Admitting: Dermatology

## 2023-08-22 DIAGNOSIS — D692 Other nonthrombocytopenic purpura: Secondary | ICD-10-CM

## 2023-08-22 DIAGNOSIS — W908XXA Exposure to other nonionizing radiation, initial encounter: Secondary | ICD-10-CM

## 2023-08-22 DIAGNOSIS — Z1283 Encounter for screening for malignant neoplasm of skin: Secondary | ICD-10-CM

## 2023-08-22 DIAGNOSIS — D229 Melanocytic nevi, unspecified: Secondary | ICD-10-CM

## 2023-08-22 DIAGNOSIS — L578 Other skin changes due to chronic exposure to nonionizing radiation: Secondary | ICD-10-CM | POA: Diagnosis not present

## 2023-08-22 DIAGNOSIS — L814 Other melanin hyperpigmentation: Secondary | ICD-10-CM

## 2023-08-22 DIAGNOSIS — L821 Other seborrheic keratosis: Secondary | ICD-10-CM

## 2023-08-22 DIAGNOSIS — L570 Actinic keratosis: Secondary | ICD-10-CM

## 2023-08-22 DIAGNOSIS — D1801 Hemangioma of skin and subcutaneous tissue: Secondary | ICD-10-CM

## 2023-08-22 DIAGNOSIS — Z872 Personal history of diseases of the skin and subcutaneous tissue: Secondary | ICD-10-CM

## 2023-08-22 NOTE — Patient Instructions (Addendum)

## 2023-08-22 NOTE — Progress Notes (Signed)
 Follow-Up Visit   Subjective  Robert Brooks is a 63 y.o. male who presents for the following: Skin Cancer Screening and Full Body Skin Exam. Hx of AKs. No personal Hx of skin cancer or dysplastic nevi.  Area of concern at left temple. Patient picks at.   The patient presents for Total-Body Skin Exam (TBSE) for skin cancer screening and mole check. The patient has spots, moles and lesions to be evaluated, some may be new or changing and the patient may have concern these could be cancer.  The following portions of the chart were reviewed this encounter and updated as appropriate: medications, allergies, medical history  Review of Systems:  No other skin or systemic complaints except as noted in HPI or Assessment and Plan.  Objective  Well appearing patient in no apparent distress; mood and affect are within normal limits.  A full examination was performed including scalp, head, eyes, ears, nose, lips, neck, chest, axillae, abdomen, back, buttocks, bilateral upper extremities, bilateral lower extremities, hands, feet, fingers, toes, fingernails, and toenails. All findings within normal limits unless otherwise noted below.   Relevant physical exam findings are noted in the Assessment and Plan.  R temple x3, R arm x11, L temple x2 (16) Erythematous thin papules/macules with gritty scale.   Assessment & Plan   SKIN CANCER SCREENING PERFORMED TODAY.  ACTINIC DAMAGE - Chronic condition, secondary to cumulative UV/sun exposure - diffuse scaly erythematous macules with underlying dyspigmentation - Recommend daily broad spectrum sunscreen SPF 30+ to sun-exposed areas, reapply every 2 hours as needed.  - Staying in the shade or wearing long sleeves, sun glasses (UVA+UVB protection) and wide brim hats (4-inch brim around the entire circumference of the hat) are also recommended for sun protection.  - Call for new or changing lesions.  LENTIGINES, SEBORRHEIC KERATOSES, HEMANGIOMAS -  Benign normal skin lesions - Benign-appearing - Call for any changes  MELANOCYTIC NEVI - Tan-brown and/or pink-flesh-colored symmetric macules and papules - Benign appearing on exam today - Observation - Call clinic for new or changing moles - Recommend daily use of broad spectrum spf 30+ sunscreen to sun-exposed areas.   Purpura - Chronic; persistent and recurrent.  Treatable, but not curable. - Violaceous macules and patches at B/L arms - Benign - Related to trauma, age, sun damage and/or use of blood thinners, chronic use of topical and/or oral steroids - Observe - Can use OTC arnica containing moisturizer such as Dermend Bruise Formula if desired - Call for worsening or other concerns  AK (ACTINIC KERATOSIS) (16) R temple x3, R arm x11, L temple x2 (16) Actinic keratoses are precancerous spots that appear secondary to cumulative UV radiation exposure/sun exposure over time. They are chronic with expected duration over 1 year. A portion of actinic keratoses will progress to squamous cell carcinoma of the skin. It is not possible to reliably predict which spots will progress to skin cancer and so treatment is recommended to prevent development of skin cancer.  Recommend daily broad spectrum sunscreen SPF 30+ to sun-exposed areas, reapply every 2 hours as needed.  Recommend staying in the shade or wearing long sleeves, sun glasses (UVA+UVB protection) and wide brim hats (4-inch brim around the entire circumference of the hat). Call for new or changing lesions. Destruction of lesion - R temple x3, R arm x11, L temple x2 (16) Complexity: simple   Destruction method: cryotherapy   Informed consent: discussed and consent obtained   Timeout:  patient name, date of birth, surgical  site, and procedure verified Lesion destroyed using liquid nitrogen: Yes   Region frozen until ice ball extended beyond lesion: Yes   Outcome: patient tolerated procedure well with no complications    Post-procedure details: wound care instructions given   Additional details:  Prior to procedure, discussed risks of blister formation, small wound, skin dyspigmentation, or rare scar following cryotherapy. Recommend Vaseline ointment to treated areas while healing.  Return in about 1 year (around 08/21/2024) for TBSE, HxAKs.  I, Jill Parcell, CMA, am acting as scribe for Celine Collard, MD.   Documentation: I have reviewed the above documentation for accuracy and completeness, and I agree with the above.  Celine Collard, MD

## 2023-10-06 ENCOUNTER — Encounter: Payer: Self-pay | Admitting: Family Medicine

## 2023-10-07 ENCOUNTER — Other Ambulatory Visit: Payer: Self-pay

## 2023-10-07 DIAGNOSIS — Z79899 Other long term (current) drug therapy: Secondary | ICD-10-CM

## 2023-10-07 DIAGNOSIS — Z8739 Personal history of other diseases of the musculoskeletal system and connective tissue: Secondary | ICD-10-CM

## 2023-10-07 DIAGNOSIS — Z0000 Encounter for general adult medical examination without abnormal findings: Secondary | ICD-10-CM

## 2023-10-07 DIAGNOSIS — I10 Essential (primary) hypertension: Secondary | ICD-10-CM

## 2023-10-07 DIAGNOSIS — Z125 Encounter for screening for malignant neoplasm of prostate: Secondary | ICD-10-CM

## 2023-10-07 DIAGNOSIS — E782 Mixed hyperlipidemia: Secondary | ICD-10-CM

## 2023-10-07 NOTE — Telephone Encounter (Signed)
 Nurses Please order the following PSA, metabolic 7, urine ACR, lipid, liver, CBC, uric acid  Diagnosis-history of gout, screening prostate cancer, HTN, hyperlipidemia, high risk med, wellness visit  Please also forward a message to Rhiley to have him do lab work before follow-up visit  Thanks-Dr. Glendia

## 2023-10-11 ENCOUNTER — Ambulatory Visit: Payer: Self-pay | Admitting: Family Medicine

## 2023-10-12 LAB — BASIC METABOLIC PANEL WITH GFR
BUN/Creatinine Ratio: 16 (ref 10–24)
BUN: 21 mg/dL (ref 8–27)
CO2: 19 mmol/L — ABNORMAL LOW (ref 20–29)
Calcium: 10 mg/dL (ref 8.6–10.2)
Chloride: 103 mmol/L (ref 96–106)
Creatinine, Ser: 1.3 mg/dL — ABNORMAL HIGH (ref 0.76–1.27)
Glucose: 98 mg/dL (ref 70–99)
Potassium: 4.9 mmol/L (ref 3.5–5.2)
Sodium: 140 mmol/L (ref 134–144)
eGFR: 63 mL/min/1.73 (ref >59)

## 2023-10-12 LAB — LIPASE: Lipase: 41 U/L (ref 13–78)

## 2023-10-12 LAB — CBC
Hematocrit: 53 % — ABNORMAL HIGH (ref 37.5–51.0)
Hemoglobin: 17.5 g/dL (ref 13.0–17.7)
MCH: 30.4 pg (ref 26.6–33.0)
MCHC: 33 g/dL (ref 31.5–35.7)
MCV: 92 fL (ref 79–97)
Platelets: 273 x10E3/uL (ref 150–450)
RBC: 5.76 x10E6/uL (ref 4.14–5.80)
RDW: 13.6 % (ref 11.6–15.4)
WBC: 8.7 x10E3/uL (ref 3.4–10.8)

## 2023-10-12 LAB — MICROALBUMIN / CREATININE URINE RATIO
Creatinine, Urine: 305.4 mg/dL
Microalb/Creat Ratio: 8 mg/g{creat} (ref 0–29)
Microalbumin, Urine: 25.3 ug/mL

## 2023-10-12 LAB — URIC ACID: Uric Acid: 5.1 mg/dL (ref 3.8–8.4)

## 2023-10-12 LAB — LIPID PANEL
Chol/HDL Ratio: 3.3 ratio (ref 0.0–5.0)
Cholesterol, Total: 231 mg/dL — ABNORMAL HIGH (ref 100–199)
HDL: 69 mg/dL (ref >39)
LDL Chol Calc (NIH): 140 mg/dL — ABNORMAL HIGH (ref 0–99)
Triglycerides: 127 mg/dL (ref 0–149)
VLDL Cholesterol Cal: 22 mg/dL (ref 5–40)

## 2023-10-12 LAB — PSA: Prostate Specific Ag, Serum: 1 ng/mL (ref 0.0–4.0)

## 2023-10-20 ENCOUNTER — Ambulatory Visit (INDEPENDENT_AMBULATORY_CARE_PROVIDER_SITE_OTHER): Payer: 59 | Admitting: Family Medicine

## 2023-10-20 ENCOUNTER — Encounter: Payer: Self-pay | Admitting: Family Medicine

## 2023-10-20 VITALS — BP 123/80 | HR 67 | Temp 97.9°F | Ht 70.0 in | Wt 207.0 lb

## 2023-10-20 DIAGNOSIS — Z0001 Encounter for general adult medical examination with abnormal findings: Secondary | ICD-10-CM | POA: Diagnosis not present

## 2023-10-20 DIAGNOSIS — E7849 Other hyperlipidemia: Secondary | ICD-10-CM | POA: Diagnosis not present

## 2023-10-20 DIAGNOSIS — Z0000 Encounter for general adult medical examination without abnormal findings: Secondary | ICD-10-CM

## 2023-10-20 DIAGNOSIS — I10 Essential (primary) hypertension: Secondary | ICD-10-CM

## 2023-10-20 DIAGNOSIS — D692 Other nonthrombocytopenic purpura: Secondary | ICD-10-CM

## 2023-10-20 MED ORDER — ALLOPURINOL 300 MG PO TABS: ORAL_TABLET | ORAL | 3 refills | Status: AC

## 2023-10-20 MED ORDER — AMLODIPINE BESYLATE 2.5 MG PO TABS: ORAL_TABLET | ORAL | 3 refills | Status: AC

## 2023-10-20 NOTE — Progress Notes (Signed)
 Subjective:    Patient ID: Robert Brooks, male    DOB: 09/12/1960, 63 y.o.   MRN: [id number]  HPI The patient comes in today for a wellness visit.  The 10-year ASCVD risk score (Arnett DK, et al., 2019) is: 9.2%   Values used to calculate the score:     Age: 21 years     Clincally relevant sex: Male     Is Non-Hispanic African American: No     Diabetic: No     Tobacco smoker: No     Systolic Blood Pressure: 123 mmHg     Is BP treated: Yes     HDL Cholesterol: 69 mg/dL     Total Cholesterol: 231 mg/dL  Results for orders placed or performed in visit on 10/07/23  PSA   Collection Time: 10/10/23  8:01 AM  Result Value Ref Range   Prostate Specific Ag, Serum 1.0 0.0 - 4.0 ng/mL  Basic metabolic panel with GFR   Collection Time: 10/10/23  8:01 AM  Result Value Ref Range   Glucose 98 70 - 99 mg/dL   BUN 21 8 - 27 mg/dL   Creatinine, Ser 8.69 (H) 0.76 - 1.27 mg/dL   eGFR 63 >40 fO/fpw/8.26   BUN/Creatinine Ratio 16 10 - 24   Sodium 140 134 - 144 mmol/L   Potassium 4.9 3.5 - 5.2 mmol/L   Chloride 103 96 - 106 mmol/L   CO2 19 (L) 20 - 29 mmol/L   Calcium 10.0 8.6 - 10.2 mg/dL  CBC   Collection Time: 10/10/23  8:01 AM  Result Value Ref Range   WBC 8.7 3.4 - 10.8 x10E3/uL   RBC 5.76 4.14 - 5.80 x10E6/uL   Hemoglobin 17.5 13.0 - 17.7 g/dL   Hematocrit 46.9 (H) 62.4 - 51.0 %   MCV 92 79 - 97 fL   MCH 30.4 26.6 - 33.0 pg   MCHC 33.0 31.5 - 35.7 g/dL   RDW 86.3 88.3 - 84.5 %   Platelets 273 150 - 450 x10E3/uL  Lipase   Collection Time: 10/10/23  8:01 AM  Result Value Ref Range   Lipase 41 13 - 78 U/L  Lipid panel   Collection Time: 10/10/23  8:01 AM  Result Value Ref Range   Cholesterol, Total 231 (H) 100 - 199 mg/dL   Triglycerides 872 0 - 149 mg/dL   HDL 69 >60 mg/dL   VLDL Cholesterol Cal 22 5 - 40 mg/dL   LDL Chol Calc (NIH) 859 (H) 0 - 99 mg/dL   Chol/HDL Ratio 3.3 0.0 - 5.0 ratio  Microalbumin / creatinine urine ratio   Collection Time: 10/10/23  8:01 AM   Result Value Ref Range   Creatinine, Urine 305.4 Not Estab. mg/dL   Microalbumin, Urine 74.6 Not Estab. ug/mL   Microalb/Creat Ratio 8 0 - 29 mg/g creat  Uric Acid   Collection Time: 10/10/23  8:01 AM  Result Value Ref Range   Uric Acid 5.1 3.8 - 8.4 mg/dL    Coronary calcium from 2022 was reviewed was negative-0 A review of their health history was completed.  A review of medications was also completed.  Any needed refills; no  Eating habits: good  Falls/  MVA accidents in past few months: no  Regular exercise: at work and around the house  Specialist pt sees on regular basis:  Patient does relate he uses chewing tobacco-snuff Does not smoke Occasional alcohol use-beer Denies chest tightness pressure pain with activity Stress levels are  reasonable Works full-time at [employer] but contemplating retirement toward the end of the year  Preventative health issues were discussed.   Additional concerns: cholesterol check  Had recent labs , most recent gout flare 3 yrs ago  Review of Systems     Objective:   Physical Exam  General-in no acute distress Eyes-no discharge Lungs-respiratory rate normal, CTA CV-no murmurs,RRR Extremities skin warm dry no edema Neuro grossly normal Behavior normal, alert Prostate exam soft no nodules Senile purpura left arm     Assessment & Plan:  1. Well adult exam (Primary) Adult wellness-complete.wellness physical was conducted today. Importance of diet and exercise were discussed in detail.  Importance of stress reduction and healthy living were discussed.  In addition to this a discussion regarding safety was also covered.  We also reviewed over immunizations and gave recommendations regarding current immunization needed for age.   In addition to this additional areas were also touched on including: Preventative health exams needed:  Colonoscopy 2027  Patient was advised yearly wellness exam  - Basic Metabolic  Panel - Lipid Panel  2. Other hyperlipidemia Significant hyperlipidemia patient does not want to be on medication He will work hard on a healthier diet with healthier choices less eating out and he will repeat the lab work again in January Patient is open to medicines his numbers get worse Also would recommend another coronary calcium in 1 year if he decides not to be on medicine - Lipid Panel  3. Essential hypertension Creatinine slightly elevated More than likely related to lack of hydration at the time of getting the blood drawn He will repeat this again in January and being well-hydrated Blood pressure under good control continue current measures - Basic Metabolic Panel  Patient does have senile purpura on the left arm states his skin has become more thin as he gets older in quickly bleeds with minimal scratches but the bleeding does stop with pressure Flu vaccine pneumococcal vaccine advised patient will get through pharmacy

## 2024-04-12 ENCOUNTER — Ambulatory Visit: Payer: Self-pay | Admitting: Family Medicine

## 2024-04-12 LAB — LIPID PANEL
Chol/HDL Ratio: 4 ratio (ref 0.0–5.0)
Cholesterol, Total: 237 mg/dL — ABNORMAL HIGH (ref 100–199)
HDL: 60 mg/dL
LDL Chol Calc (NIH): 151 mg/dL — ABNORMAL HIGH (ref 0–99)
Triglycerides: 147 mg/dL (ref 0–149)
VLDL Cholesterol Cal: 26 mg/dL (ref 5–40)

## 2024-04-12 LAB — BASIC METABOLIC PANEL WITH GFR
BUN/Creatinine Ratio: 15 (ref 10–24)
BUN: 19 mg/dL (ref 8–27)
CO2: 21 mmol/L (ref 20–29)
Calcium: 9.5 mg/dL (ref 8.6–10.2)
Chloride: 103 mmol/L (ref 96–106)
Creatinine, Ser: 1.31 mg/dL — ABNORMAL HIGH (ref 0.76–1.27)
Glucose: 108 mg/dL — ABNORMAL HIGH (ref 70–99)
Potassium: 4.6 mmol/L (ref 3.5–5.2)
Sodium: 139 mmol/L (ref 134–144)
eGFR: 62 mL/min/{1.73_m2}

## 2024-08-21 ENCOUNTER — Ambulatory Visit: Admitting: Dermatology

## 2024-10-22 ENCOUNTER — Encounter: Admitting: Family Medicine
